# Patient Record
Sex: Female | Born: 1975 | Race: White | Hispanic: No | Marital: Married | Smoking: Never smoker
Health system: Southern US, Community
[De-identification: ages and names within clinical notes are randomized; demographics above are authoritative.]

## PROBLEM LIST (undated history)

## (undated) DIAGNOSIS — Z789 Other specified health status: Secondary | ICD-10-CM

## (undated) DIAGNOSIS — N83209 Unspecified ovarian cyst, unspecified side: Secondary | ICD-10-CM

## (undated) DIAGNOSIS — F419 Anxiety disorder, unspecified: Secondary | ICD-10-CM

## (undated) DIAGNOSIS — Z923 Personal history of irradiation: Secondary | ICD-10-CM

## (undated) DIAGNOSIS — IMO0002 Reserved for concepts with insufficient information to code with codable children: Secondary | ICD-10-CM

## (undated) DIAGNOSIS — R87619 Unspecified abnormal cytological findings in specimens from cervix uteri: Secondary | ICD-10-CM

## (undated) HISTORY — PX: OVARIAN CYST SURGERY: SHX726

## (undated) HISTORY — PX: COLPOSCOPY: SHX161

---

## 1999-08-18 ENCOUNTER — Other Ambulatory Visit: Admission: RE | Admit: 1999-08-18 | Discharge: 1999-08-18 | Payer: Self-pay | Admitting: Family Medicine

## 2000-08-21 ENCOUNTER — Other Ambulatory Visit: Admission: RE | Admit: 2000-08-21 | Discharge: 2000-08-21 | Payer: Self-pay | Admitting: Family Medicine

## 2001-09-03 ENCOUNTER — Other Ambulatory Visit: Admission: RE | Admit: 2001-09-03 | Discharge: 2001-09-03 | Payer: Self-pay | Admitting: Family Medicine

## 2002-10-04 ENCOUNTER — Other Ambulatory Visit: Admission: RE | Admit: 2002-10-04 | Discharge: 2002-10-04 | Payer: Self-pay | Admitting: Family Medicine

## 2003-10-28 ENCOUNTER — Other Ambulatory Visit: Admission: RE | Admit: 2003-10-28 | Discharge: 2003-10-28 | Payer: Self-pay | Admitting: Family Medicine

## 2004-11-01 ENCOUNTER — Ambulatory Visit: Payer: Self-pay | Admitting: Family Medicine

## 2004-11-01 ENCOUNTER — Other Ambulatory Visit: Admission: RE | Admit: 2004-11-01 | Discharge: 2004-11-01 | Payer: Self-pay | Admitting: Family Medicine

## 2005-01-06 ENCOUNTER — Ambulatory Visit: Payer: Self-pay | Admitting: Family Medicine

## 2005-04-08 ENCOUNTER — Ambulatory Visit: Payer: Self-pay | Admitting: Family Medicine

## 2005-10-14 ENCOUNTER — Ambulatory Visit: Payer: Self-pay | Admitting: Family Medicine

## 2005-11-18 ENCOUNTER — Other Ambulatory Visit: Admission: RE | Admit: 2005-11-18 | Discharge: 2005-11-18 | Payer: Self-pay | Admitting: Family Medicine

## 2005-11-18 ENCOUNTER — Ambulatory Visit: Payer: Self-pay | Admitting: Family Medicine

## 2007-03-12 ENCOUNTER — Telehealth: Payer: Self-pay | Admitting: Family Medicine

## 2007-04-18 ENCOUNTER — Encounter (INDEPENDENT_AMBULATORY_CARE_PROVIDER_SITE_OTHER): Payer: Self-pay | Admitting: Nurse Practitioner

## 2007-04-18 LAB — CONVERTED CEMR LAB
AST: 17 units/L
BUN: 7 mg/dL
Calcium: 9.7 mg/dL
Chloride: 104 meq/L
Glucose, Bld: 95 mg/dL
HDL: 64 mg/dL
Total Bilirubin: 0.7 mg/dL
Total CHOL/HDL Ratio: 2.9
Triglycerides: 80 mg/dL

## 2007-05-12 ENCOUNTER — Encounter (INDEPENDENT_AMBULATORY_CARE_PROVIDER_SITE_OTHER): Payer: Self-pay | Admitting: Nurse Practitioner

## 2007-05-14 DIAGNOSIS — I10 Essential (primary) hypertension: Secondary | ICD-10-CM | POA: Insufficient documentation

## 2011-02-11 ENCOUNTER — Other Ambulatory Visit (HOSPITAL_COMMUNITY): Payer: Self-pay | Admitting: Obstetrics and Gynecology

## 2011-02-11 ENCOUNTER — Ambulatory Visit (HOSPITAL_COMMUNITY)
Admission: RE | Admit: 2011-02-11 | Discharge: 2011-02-11 | Disposition: A | Discharge status: Home / Self Care | Payer: 59 | Source: Ambulatory Visit | Attending: Obstetrics and Gynecology | Admitting: Obstetrics and Gynecology

## 2011-02-11 DIAGNOSIS — N801 Endometriosis of ovary: Secondary | ICD-10-CM | POA: Insufficient documentation

## 2011-02-11 DIAGNOSIS — N83209 Unspecified ovarian cyst, unspecified side: Secondary | ICD-10-CM | POA: Insufficient documentation

## 2011-02-11 DIAGNOSIS — N80109 Endometriosis of ovary, unspecified side, unspecified depth: Secondary | ICD-10-CM | POA: Insufficient documentation

## 2011-02-11 LAB — CBC
HCT: 43.1 % (ref 36.0–46.0)
Hemoglobin: 14.5 g/dL (ref 12.0–15.0)
MCHC: 33.6 g/dL (ref 30.0–36.0)
MCV: 92.1 fL (ref 78.0–100.0)
RDW: 13.4 % (ref 11.5–15.5)
WBC: 7.2 10*3/uL (ref 4.0–10.5)

## 2011-02-11 LAB — TYPE AND SCREEN
ABO/RH(D): A POS
Antibody Screen: POSITIVE
DAT, IgG: NEGATIVE

## 2011-02-11 LAB — SURGICAL PCR SCREEN: Staphylococcus aureus: NEGATIVE

## 2011-02-25 NOTE — Op Note (Signed)
NAME:  Sydney Giles, Sydney Giles              ACCOUNT NO.:  [id number]  MEDICAL RECORD NO.:  [id number]           PATIENT TYPE:  O  LOCATION:  [facility]                          [facility]:  [facility]  PHYSICIAN:  Zelphia Cairo, MD    DATE OF BIRTH:  09-22-1976  DATE OF PROCEDURE:  02/11/2011 DATE OF DISCHARGE:                              OPERATIVE REPORT   PREOPERATIVE DIAGNOSIS:  Left ovarian cyst.  POSTOPERATIVE DIAGNOSES:  Left ovarian cyst, endometriosis, pathology pending.  PROCEDURES: 1. Diagnostic laparoscopy. 2. Lysis of adhesions. 3. Left ovarian cystectomy.  SURGEON:  Zelphia Cairo, MD  ANESTHESIA:  General.  FINDINGS: 1. Left endometrioma. 2. Adhesions of left ovary to the left ovarian fossa and sigmoid     colon.  SPECIMEN:  Left ovarian cyst wall.  BLOOD LOSS:  Minimal.  COMPLICATIONS:  Uterine perforation.  CONDITION:  Stable to recovery room.  PROCEDURE:  Trace was taken to the operating room after informed consent was obtained.  She was given general anesthesia and placed in the dorsal lithotomy position using Allen stirrups.  She was prepped and draped in sterile fashion and an in-and-out catheter was used to drain her bladder.  A bivalve speculum was placed in the vagina and a single- tooth tenaculum placed on the anterior lip of the cervix.  The Hulka clamp was placed through the cervix and attached to the anterior lip of the cervix.  The tenaculum and speculum were removed, then our attention was turned to the abdomen.  Marcaine 0.25% was used to provide local anesthesia at the site of our abdominal incision.  A scalpel was then used to make an infraumbilical skin incision.  This was extended to the level of the fascia bluntly using a Kelly clamp.  Optical trocar was then inserted under direct visualization.  Once intraperitoneal placement was confirmed, CO2 was turned on and the abdomen and pelvis were visualized.  Right upper quadrant and appendix  appeared normal.  Right ovary and fallopian tube appeared normal.  Uterus appeared normal.  The left ovary was enlarged and the posterior side of the ovary was adhered to the left ovarian fossa.  The left fallopian tube was adhered with thin filmy adhesions to the left sigmoid colon and left pelvic sidewall.  Monopolar scissors were used to dissect adhesions from the left fallopian tube.  A 5-mm incision was then made in the suprapubic region and a 5-mm trocar was inserted under direct visualization.  Atraumatic graspers were used to grasp the left ovary.  The patient was placed in Trendelenburg position and scissors were used to make a small incision over the left ovarian cyst wall.  The cyst wall was ruptured and endometrioma substance spilled into the pelvis.  The million-dollar graspers were then used to dissect the ovarian cyst wall from the ovarian tissue.  The Enseal device was then used to transect the base of the ovarian cyst wall from the ovary.  The base of the ovary and cyst wall were then cauterized. The pelvis was then copiously irrigated.  When the uterus was being tented upwards with the Hulka clamp, anterior perforation occurred, Hulka clamp was  removed.  A small amount of oozing from the anterior uterus was noted, but this was cauterized and hemostasis was achieved. The left ovary was then found to be hemostatic and Interceed was draped over the left ovary as an adhesion barrier.  All instruments were then removed from the abdomen.  A deep stitch was placed in the infraumbilical skin incision and the skin was reapproximated with Vicryl and Dermabond.  The patient was then taken to the recovery room in stable condition.  Sponge, lap, needle and instrument counts were correct x2.     Zelphia Cairo, MD     GA/MEDQ  D:  02/11/2011  T:  02/11/2011  Job:  [id number]  Electronically Signed by Zelphia Cairo MD on 02/25/2011 11:10:33 AM

## 2011-07-04 ENCOUNTER — Other Ambulatory Visit (HOSPITAL_COMMUNITY): Payer: Self-pay | Admitting: Obstetrics and Gynecology

## 2011-07-04 DIAGNOSIS — N979 Female infertility, unspecified: Secondary | ICD-10-CM

## 2011-07-11 ENCOUNTER — Ambulatory Visit (HOSPITAL_COMMUNITY)
Admission: RE | Admit: 2011-07-11 | Discharge: 2011-07-11 | Disposition: A | Discharge status: Home / Self Care | Payer: 59 | Source: Ambulatory Visit | Attending: Obstetrics and Gynecology | Admitting: Obstetrics and Gynecology

## 2011-07-11 DIAGNOSIS — N979 Female infertility, unspecified: Secondary | ICD-10-CM

## 2011-07-11 MED ORDER — IOHEXOL 300 MG/ML  SOLN: 45.0000 mL | Freq: Once | INTRAMUSCULAR | Status: AC | PRN

## 2011-10-04 NOTE — L&D Delivery Note (Signed)
Delivery Note At 5:45 PM a viable female was delivered via Vaginal, Spontaneous Delivery (Presentation: Left Occiput Anterior).  APGAR: 9, 9; weight .   Placenta status: Intact, Spontaneous.  Cord: 3 vessels with the following complications: None.  Cord pH: na  Anesthesia: Epidural  Episiotomy: None Lacerations: 1st degree;Perineal Suture Repair: 2.0 chromic Est. Blood Loss (mL): 400  Mom to postpartum.  Baby to nursery-stable.  Manish Ruggiero S 05/13/2012, 5:56 PM

## 2011-10-18 LAB — OB RESULTS CONSOLE HIV ANTIBODY (ROUTINE TESTING): HIV: NONREACTIVE

## 2011-10-18 LAB — OB RESULTS CONSOLE ABO/RH

## 2011-10-18 LAB — OB RESULTS CONSOLE RPR: RPR: NONREACTIVE

## 2011-11-02 LAB — OB RESULTS CONSOLE GC/CHLAMYDIA: Chlamydia: NEGATIVE

## 2012-04-30 LAB — OB RESULTS CONSOLE GBS: GBS: POSITIVE

## 2012-05-13 ENCOUNTER — Encounter (HOSPITAL_COMMUNITY): Payer: Self-pay | Admitting: Anesthesiology

## 2012-05-13 ENCOUNTER — Inpatient Hospital Stay (HOSPITAL_COMMUNITY)
Admission: AD | Admit: 2012-05-13 | Discharge: 2012-05-15 | DRG: 775 | Disposition: A | Discharge status: Home / Self Care | Payer: 59 | Source: Ambulatory Visit | Attending: Obstetrics and Gynecology | Admitting: Obstetrics and Gynecology

## 2012-05-13 ENCOUNTER — Inpatient Hospital Stay (HOSPITAL_COMMUNITY): Payer: 59 | Admitting: Anesthesiology

## 2012-05-13 ENCOUNTER — Encounter (HOSPITAL_COMMUNITY): Payer: Self-pay | Admitting: *Deleted

## 2012-05-13 DIAGNOSIS — I10 Essential (primary) hypertension: Secondary | ICD-10-CM

## 2012-05-13 DIAGNOSIS — Z22330 Carrier of Group B streptococcus: Secondary | ICD-10-CM

## 2012-05-13 DIAGNOSIS — O99892 Other specified diseases and conditions complicating childbirth: Secondary | ICD-10-CM | POA: Diagnosis present

## 2012-05-13 DIAGNOSIS — O09519 Supervision of elderly primigravida, unspecified trimester: Secondary | ICD-10-CM | POA: Diagnosis present

## 2012-05-13 HISTORY — DX: Unspecified abnormal cytological findings in specimens from cervix uteri: R87.619

## 2012-05-13 HISTORY — DX: Reserved for concepts with insufficient information to code with codable children: IMO0002

## 2012-05-13 HISTORY — DX: Unspecified ovarian cyst, unspecified side: N83.209

## 2012-05-13 HISTORY — DX: Other specified health status: Z78.9

## 2012-05-13 LAB — POCT FERN TEST: Fern Test: POSITIVE

## 2012-05-13 LAB — CBC
HCT: 35.4 % — ABNORMAL LOW (ref 36.0–46.0)
Hemoglobin: 11.5 g/dL — ABNORMAL LOW (ref 12.0–15.0)
RBC: 3.9 MIL/uL (ref 3.87–5.11)
WBC: 11.3 10*3/uL — ABNORMAL HIGH (ref 4.0–10.5)

## 2012-05-13 LAB — RPR: RPR Ser Ql: REACTIVE — AB

## 2012-05-13 LAB — RPR TITER: RPR Titer: 1:2 {titer} — AB

## 2012-05-13 LAB — TYPE AND SCREEN: ABO/RH(D): A POS

## 2012-05-13 MED ORDER — CLINDAMYCIN PHOSPHATE 900 MG/50ML IV SOLN
900.0000 mg | Freq: Once | INTRAVENOUS | Status: AC
  Administered 2012-05-13: 900 mg via INTRAVENOUS
  Filled 2012-05-13: qty 50

## 2012-05-13 MED ORDER — LACTATED RINGERS IV SOLN
500.0000 mL | Freq: Once | INTRAVENOUS | Status: AC
  Administered 2012-05-13: 10:00:00 via INTRAVENOUS

## 2012-05-13 MED ORDER — EPHEDRINE 5 MG/ML INJ
10.0000 mg | INTRAVENOUS | Status: DC | PRN
  Filled 2012-05-13: qty 4

## 2012-05-13 MED ORDER — LACTATED RINGERS IV SOLN: 500.0000 mL | INTRAVENOUS | Status: DC | PRN

## 2012-05-13 MED ORDER — ACETAMINOPHEN 325 MG PO TABS: 650.0000 mg | ORAL_TABLET | ORAL | Status: DC | PRN

## 2012-05-13 MED ORDER — IBUPROFEN 600 MG PO TABS
600.0000 mg | ORAL_TABLET | Freq: Four times a day (QID) | ORAL | Status: DC
  Administered 2012-05-13 – 2012-05-15 (×5): 600 mg via ORAL
  Filled 2012-05-13 (×6): qty 1

## 2012-05-13 MED ORDER — SIMETHICONE 80 MG PO CHEW: 80.0000 mg | CHEWABLE_TABLET | ORAL | Status: DC | PRN

## 2012-05-13 MED ORDER — SODIUM BICARBONATE 8.4 % IV SOLN
INTRAVENOUS | Status: DC | PRN
  Administered 2012-05-13: 4 mL via EPIDURAL

## 2012-05-13 MED ORDER — ONDANSETRON HCL 4 MG PO TABS: 4.0000 mg | ORAL_TABLET | ORAL | Status: DC | PRN

## 2012-05-13 MED ORDER — FLEET ENEMA 7-19 GM/118ML RE ENEM: 1.0000 | ENEMA | Freq: Every day | RECTAL | Status: DC | PRN

## 2012-05-13 MED ORDER — ONDANSETRON HCL 4 MG/2ML IJ SOLN: 4.0000 mg | Freq: Four times a day (QID) | INTRAMUSCULAR | Status: DC | PRN

## 2012-05-13 MED ORDER — EPHEDRINE 5 MG/ML INJ: 10.0000 mg | INTRAVENOUS | Status: DC | PRN

## 2012-05-13 MED ORDER — DIPHENHYDRAMINE HCL 25 MG PO CAPS: 25.0000 mg | ORAL_CAPSULE | Freq: Four times a day (QID) | ORAL | Status: DC | PRN

## 2012-05-13 MED ORDER — DIBUCAINE 1 % RE OINT: 1.0000 "application " | TOPICAL_OINTMENT | RECTAL | Status: DC | PRN

## 2012-05-13 MED ORDER — LANOLIN HYDROUS EX OINT: TOPICAL_OINTMENT | CUTANEOUS | Status: DC | PRN

## 2012-05-13 MED ORDER — ZOLPIDEM TARTRATE 5 MG PO TABS: 5.0000 mg | ORAL_TABLET | Freq: Every evening | ORAL | Status: DC | PRN

## 2012-05-13 MED ORDER — FENTANYL 2.5 MCG/ML BUPIVACAINE 1/10 % EPIDURAL INFUSION (WH - ANES)
14.0000 mL/h | INTRAMUSCULAR | Status: DC
  Filled 2012-05-13 (×3): qty 60

## 2012-05-13 MED ORDER — SENNOSIDES-DOCUSATE SODIUM 8.6-50 MG PO TABS
2.0000 | ORAL_TABLET | Freq: Every day | ORAL | Status: DC
  Administered 2012-05-13 – 2012-05-14 (×2): 2 via ORAL

## 2012-05-13 MED ORDER — TERBUTALINE SULFATE 1 MG/ML IJ SOLN: 0.2500 mg | Freq: Once | INTRAMUSCULAR | Status: DC | PRN

## 2012-05-13 MED ORDER — TETANUS-DIPHTH-ACELL PERTUSSIS 5-2.5-18.5 LF-MCG/0.5 IM SUSP: 0.5000 mL | Freq: Once | INTRAMUSCULAR | Status: DC

## 2012-05-13 MED ORDER — OXYCODONE-ACETAMINOPHEN 5-325 MG PO TABS: 1.0000 | ORAL_TABLET | ORAL | Status: DC | PRN

## 2012-05-13 MED ORDER — WITCH HAZEL-GLYCERIN EX PADS: 1.0000 "application " | MEDICATED_PAD | CUTANEOUS | Status: DC | PRN

## 2012-05-13 MED ORDER — PHENYLEPHRINE 40 MCG/ML (10ML) SYRINGE FOR IV PUSH (FOR BLOOD PRESSURE SUPPORT)
80.0000 ug | PREFILLED_SYRINGE | INTRAVENOUS | Status: DC | PRN
  Administered 2012-05-13: 80 ug via INTRAVENOUS

## 2012-05-13 MED ORDER — PHENYLEPHRINE 40 MCG/ML (10ML) SYRINGE FOR IV PUSH (FOR BLOOD PRESSURE SUPPORT)
80.0000 ug | PREFILLED_SYRINGE | INTRAVENOUS | Status: DC | PRN
  Administered 2012-05-13: 80 ug via INTRAVENOUS
  Filled 2012-05-13: qty 5

## 2012-05-13 MED ORDER — FENTANYL 2.5 MCG/ML BUPIVACAINE 1/10 % EPIDURAL INFUSION (WH - ANES)
INTRAMUSCULAR | Status: DC | PRN
  Administered 2012-05-13: 14 mL/h via EPIDURAL

## 2012-05-13 MED ORDER — BISACODYL 10 MG RE SUPP: 10.0000 mg | Freq: Every day | RECTAL | Status: DC | PRN

## 2012-05-13 MED ORDER — DIPHENHYDRAMINE HCL 50 MG/ML IJ SOLN: 12.5000 mg | INTRAMUSCULAR | Status: DC | PRN

## 2012-05-13 MED ORDER — FLEET ENEMA 7-19 GM/118ML RE ENEM: 1.0000 | ENEMA | RECTAL | Status: DC | PRN

## 2012-05-13 MED ORDER — LIDOCAINE HCL (PF) 1 % IJ SOLN
30.0000 mL | INTRAMUSCULAR | Status: DC | PRN
  Administered 2012-05-13: 30 mL via SUBCUTANEOUS
  Filled 2012-05-13: qty 30

## 2012-05-13 MED ORDER — LACTATED RINGERS IV SOLN
INTRAVENOUS | Status: DC
  Administered 2012-05-13 (×2): via INTRAVENOUS

## 2012-05-13 MED ORDER — PRENATAL MULTIVITAMIN CH
1.0000 | ORAL_TABLET | Freq: Every day | ORAL | Status: DC
  Administered 2012-05-14 – 2012-05-15 (×2): 1 via ORAL
  Filled 2012-05-13 (×2): qty 1

## 2012-05-13 MED ORDER — OXYTOCIN 40 UNITS IN LACTATED RINGERS INFUSION - SIMPLE MED
1.0000 m[IU]/min | INTRAVENOUS | Status: DC
  Administered 2012-05-13: 2 m[IU]/min via INTRAVENOUS
  Filled 2012-05-13: qty 1000

## 2012-05-13 MED ORDER — BENZOCAINE-MENTHOL 20-0.5 % EX AERO
1.0000 "application " | INHALATION_SPRAY | CUTANEOUS | Status: DC | PRN
  Administered 2012-05-13: 1 via TOPICAL
  Filled 2012-05-13: qty 56

## 2012-05-13 MED ORDER — OXYTOCIN 40 UNITS IN LACTATED RINGERS INFUSION - SIMPLE MED
62.5000 mL/h | Freq: Once | INTRAVENOUS | Status: AC
  Administered 2012-05-13: 62.5 mL/h via INTRAVENOUS

## 2012-05-13 MED ORDER — CITRIC ACID-SODIUM CITRATE 334-500 MG/5ML PO SOLN: 30.0000 mL | ORAL | Status: DC | PRN

## 2012-05-13 MED ORDER — OXYTOCIN BOLUS FROM INFUSION
250.0000 mL | Freq: Once | INTRAVENOUS | Status: DC
  Filled 2012-05-13: qty 500

## 2012-05-13 MED ORDER — IBUPROFEN 600 MG PO TABS: 600.0000 mg | ORAL_TABLET | Freq: Four times a day (QID) | ORAL | Status: DC | PRN

## 2012-05-13 MED ORDER — CLINDAMYCIN PHOSPHATE 900 MG/50ML IV SOLN
900.0000 mg | Freq: Three times a day (TID) | INTRAVENOUS | Status: DC
  Administered 2012-05-13: 900 mg via INTRAVENOUS
  Filled 2012-05-13 (×2): qty 50

## 2012-05-13 MED ORDER — ONDANSETRON HCL 4 MG/2ML IJ SOLN: 4.0000 mg | INTRAMUSCULAR | Status: DC | PRN

## 2012-05-13 NOTE — H&P (Signed)
Sydney Giles is a 36 y.o. female  At 61.6 with SROM.  Positive GBS. Maternal Medical History:  Reason for admission: Reason for admission: rupture of membranes.  Contractions: Onset was 3-5 hours ago.   Frequency: irregular.   Perceived severity is mild.    Fetal activity: Perceived fetal activity is normal.    Prenatal complications: no prenatal complications Prenatal Complications - Diabetes: none.    OB History    Grav Para Term Preterm Abortions TAB SAB Ect Mult Living   1              Past Medical History  Diagnosis Date  . No pertinent past medical history   . Abnormal Pap smear   . Ovarian cyst    Past Surgical History  Procedure Date  . Ovarian cyst surgery   . Colposcopy    Family History: family history is not on file. Social History:  reports that she has never smoked. She has never used smokeless tobacco. She reports that she does not drink alcohol or use illicit drugs.   Prenatal Transfer Tool  Maternal Diabetes: No Genetic Screening: Declined Maternal Ultrasounds/Referrals: Normal Fetal Ultrasounds or other Referrals:  None Maternal Substance Abuse:  No Significant Maternal Medications:  None Significant Maternal Lab Results:  None Other Comments:  None  ROS  Dilation: 1 Effacement (%): 0 Station: -1 Exam by:: Dr.Eleanora Guinyard Blood pressure 102/53, pulse 77, temperature 98.5 F (36.9 C), temperature source Oral, resp. rate 18, height 5\' 7"  (1.702 m), weight 97.977 kg (216 lb), last menstrual period 08/22/2011, SpO2 100.00%. Maternal Exam:  Uterine Assessment: Contraction strength is mild.  Contraction frequency is irregular.   Abdomen: Patient reports no abdominal tenderness. Fundal height is c/w dates.   Estimated fetal weight is 7.   Fetal presentation: vertex  Introitus: Amniotic fluid character: clear.  Pelvis: adequate for delivery.   Cervix: Cervix evaluated by digital exam.     Physical Exam  Prenatal labs: ABO, Rh: A/Positive/--  (01/15 0000) Antibody: Negative (01/15 0000) Rubella: Immune (01/15 0000) RPR: Nonreactive (01/15 0000)  HBsAg: Negative (01/15 0000)  HIV: Non-reactive (01/15 0000)  GBS: Positive (07/29 0000)   Assessment/Plan: IUP at 37.6 with SROM. POsitive GBS IV cleocin  Risk of pitocin disscussed   Sydney Giles S 05/13/2012, 7:32 AM

## 2012-05-13 NOTE — Anesthesia Preprocedure Evaluation (Addendum)
Anesthesia Evaluation  Patient identified by MRN, date of birth, ID band Patient awake    Reviewed: Allergy & Precautions, H&P , Patient's Chart, lab work & pertinent test results  History of Anesthesia Complications (+) DIFFICULT AIRWAY  Airway Mallampati: II TM Distance: >3 FB Neck ROM: full    Dental  (+) Teeth Intact   Pulmonary  breath sounds clear to auscultation        Cardiovascular Rhythm:regular Rate:Normal     Neuro/Psych    GI/Hepatic   Endo/Other    Renal/GU      Musculoskeletal   Abdominal   Peds  Hematology   Anesthesia Other Findings       Reproductive/Obstetrics (+) Pregnancy                           Anesthesia Physical Anesthesia Plan  ASA: II  Anesthesia Plan: Epidural   Post-op Pain Management:    Induction:   Airway Management Planned:   Additional Equipment:   Intra-op Plan:   Post-operative Plan:   Informed Consent: I have reviewed the patients History and Physical, chart, labs and discussed the procedure including the risks, benefits and alternatives for the proposed anesthesia with the patient or authorized representative who has indicated his/her understanding and acceptance.   Dental Advisory Given  Plan Discussed with:   Anesthesia Plan Comments: (Labs checked- platelets confirmed with RN in room. Fetal heart tracing, per RN, reported to be stable enough for sitting procedure. Discussed epidural, and patient consents to the procedure:  included risk of possible headache,backache, failed block, allergic reaction, and nerve injury. This patient was asked if she had any questions or concerns before the procedure started. )        Anesthesia Quick Evaluation  

## 2012-05-13 NOTE — MAU Note (Signed)
C/o leaking clear fluid since 0130 and irregular ucs.

## 2012-05-13 NOTE — Anesthesia Procedure Notes (Signed)

## 2012-05-13 NOTE — Progress Notes (Signed)
Pt repositioning frequently monitors tracing maternal HR at times

## 2012-05-14 LAB — T.PALLIDUM AB, IGG: T pallidum Antibodies (TP-PA): 0.09 S/CO (ref <0.90)

## 2012-05-14 LAB — CBC
Platelets: 155 10*3/uL (ref 150–400)
RBC: 3.64 MIL/uL — ABNORMAL LOW (ref 3.87–5.11)
WBC: 17.9 10*3/uL — ABNORMAL HIGH (ref 4.0–10.5)

## 2012-05-14 NOTE — Progress Notes (Signed)
No C/O Decreasing lochia, voiding  VSS Afeb FFNT  Results for orders placed during the hospital encounter of 05/13/12 (from the past 24 hour(s))  CBC     Status: Abnormal   Collection Time   05/14/12  5:11 AM      Component Value Range   WBC 17.9 (*) 4.0 - 10.5 K/uL   RBC 3.64 (*) 3.87 - 5.11 MIL/uL   Hemoglobin 10.8 (*) 12.0 - 15.0 g/dL   HCT 11.9 (*) 14.7 - 82.9 %   MCV 90.4  78.0 - 100.0 fL   MCH 29.7  26.0 - 34.0 pg   MCHC 32.8  30.0 - 36.0 g/dL   RDW 56.2  13.0 - 86.5 %   Platelets 155  150 - 400 K/uL   A: Satisfactory  P: Per orders     Prob D/C in am

## 2012-05-14 NOTE — Anesthesia Postprocedure Evaluation (Signed)
  Anesthesia Post Note  Patient: Sydney Giles  Procedure(s) Performed: * No procedures listed *  Anesthesia type: Epidural  Patient location: Mother/Baby  Post pain: Pain level controlled  Post assessment: Post-op Vital signs reviewed  Last Vitals:  Filed Vitals:   05/14/12 0650  BP: 105/62  Pulse: 73  Temp: 36.8 C  Resp: 18    Post vital signs: Reviewed  Level of consciousness:alert  Complications: No apparent anesthesia complications

## 2012-05-15 NOTE — Discharge Summary (Signed)
Obstetric Discharge Summary Reason for Admission: rupture of membranes Prenatal Procedures: ultrasound Intrapartum Procedures: spontaneous vaginal delivery Postpartum Procedures: none Complications-Operative and Postpartum: 1 degree perineal laceration Hemoglobin  Date Value Range Status  05/14/2012 10.8* 12.0 - 15.0 g/dL Final     HCT  Date Value Range Status  05/14/2012 32.9* 36.0 - 46.0 % Final    Physical Exam:  General: alert and cooperative Lochia: appropriate Uterine Fundus: firm Incision: perineum intact DVT Evaluation: No evidence of DVT seen on physical exam.  Discharge Diagnoses: Term Pregnancy-delivered  Discharge Information: Date: 05/15/2012 Activity: pelvic rest Diet: routine Medications: PNV and Ibuprofen Condition: stable Instructions: refer to practice specific booklet Discharge to: home   Newborn Data: Live born female  Birth Weight: 7 lb 1.2 oz (3210 g) APGAR: 9, 9  Home with mother.  CURTIS,CAROL G 05/15/2012, 8:02 AM

## 2012-09-05 ENCOUNTER — Other Ambulatory Visit: Payer: Self-pay | Admitting: Obstetrics and Gynecology

## 2014-08-04 ENCOUNTER — Encounter (HOSPITAL_COMMUNITY): Payer: Self-pay | Admitting: *Deleted

## 2015-01-12 ENCOUNTER — Other Ambulatory Visit: Payer: Self-pay | Admitting: Obstetrics and Gynecology

## 2015-01-13 LAB — CYTOLOGY - PAP

## 2016-03-31 ENCOUNTER — Other Ambulatory Visit: Payer: Self-pay | Admitting: Obstetrics and Gynecology

## 2016-03-31 DIAGNOSIS — R928 Other abnormal and inconclusive findings on diagnostic imaging of breast: Secondary | ICD-10-CM

## 2016-04-08 ENCOUNTER — Ambulatory Visit
Admission: RE | Admit: 2016-04-08 | Discharge: 2016-04-08 | Disposition: A | Discharge status: Home / Self Care | Payer: 59 | Source: Ambulatory Visit | Attending: Obstetrics and Gynecology | Admitting: Obstetrics and Gynecology

## 2016-04-08 DIAGNOSIS — R928 Other abnormal and inconclusive findings on diagnostic imaging of breast: Secondary | ICD-10-CM

## 2016-07-26 ENCOUNTER — Encounter: Payer: Self-pay | Admitting: Emergency Medicine

## 2016-07-26 ENCOUNTER — Inpatient Hospital Stay
Admission: EM | Admit: 2016-07-26 | Discharge: 2016-07-28 | DRG: 872 | Disposition: A | Discharge status: Home / Self Care | Payer: 59 | Attending: Internal Medicine | Admitting: Internal Medicine

## 2016-07-26 ENCOUNTER — Emergency Department: Payer: 59

## 2016-07-26 DIAGNOSIS — N83209 Unspecified ovarian cyst, unspecified side: Secondary | ICD-10-CM | POA: Diagnosis present

## 2016-07-26 DIAGNOSIS — N83201 Unspecified ovarian cyst, right side: Secondary | ICD-10-CM | POA: Diagnosis present

## 2016-07-26 DIAGNOSIS — N12 Tubulo-interstitial nephritis, not specified as acute or chronic: Secondary | ICD-10-CM | POA: Diagnosis present

## 2016-07-26 DIAGNOSIS — R112 Nausea with vomiting, unspecified: Secondary | ICD-10-CM | POA: Diagnosis present

## 2016-07-26 DIAGNOSIS — E876 Hypokalemia: Secondary | ICD-10-CM | POA: Diagnosis present

## 2016-07-26 DIAGNOSIS — R509 Fever, unspecified: Secondary | ICD-10-CM | POA: Diagnosis present

## 2016-07-26 DIAGNOSIS — B953 Streptococcus pneumoniae as the cause of diseases classified elsewhere: Secondary | ICD-10-CM | POA: Diagnosis present

## 2016-07-26 DIAGNOSIS — B379 Candidiasis, unspecified: Secondary | ICD-10-CM | POA: Diagnosis present

## 2016-07-26 DIAGNOSIS — A419 Sepsis, unspecified organism: Principal | ICD-10-CM | POA: Diagnosis present

## 2016-07-26 DIAGNOSIS — Z793 Long term (current) use of hormonal contraceptives: Secondary | ICD-10-CM | POA: Diagnosis not present

## 2016-07-26 LAB — URINALYSIS COMPLETE WITH MICROSCOPIC (ARMC ONLY)
Bacteria, UA: NONE SEEN
Bilirubin Urine: NEGATIVE
GLUCOSE, UA: NEGATIVE mg/dL
Nitrite: NEGATIVE
Protein, ur: 30 mg/dL — AB
Specific Gravity, Urine: 1.018 (ref 1.005–1.030)
pH: 6 (ref 5.0–8.0)

## 2016-07-26 LAB — COMPREHENSIVE METABOLIC PANEL
ALBUMIN: 3.9 g/dL (ref 3.5–5.0)
ALT: 14 U/L (ref 14–54)
ANION GAP: 11 (ref 5–15)
AST: 18 U/L (ref 15–41)
Alkaline Phosphatase: 43 U/L (ref 38–126)
BILIRUBIN TOTAL: 0.9 mg/dL (ref 0.3–1.2)
BUN: 8 mg/dL (ref 6–20)
CHLORIDE: 106 mmol/L (ref 101–111)
CO2: 20 mmol/L — ABNORMAL LOW (ref 22–32)
Calcium: 9 mg/dL (ref 8.9–10.3)
Creatinine, Ser: 0.78 mg/dL (ref 0.44–1.00)
GFR calc Af Amer: >60 mL/min (ref >60)
Glucose, Bld: 128 mg/dL — ABNORMAL HIGH (ref 65–99)
POTASSIUM: 3.5 mmol/L (ref 3.5–5.1)
Sodium: 137 mmol/L (ref 135–145)
TOTAL PROTEIN: 7.4 g/dL (ref 6.5–8.1)

## 2016-07-26 LAB — CBC WITH DIFFERENTIAL/PLATELET
BASOS ABS: 0.1 10*3/uL (ref 0–0.1)
Basophils Relative: 0 %
Eosinophils Absolute: 0 10*3/uL (ref 0–0.7)
Eosinophils Relative: 0 %
HCT: 44.3 % (ref 35.0–47.0)
HEMOGLOBIN: 14.8 g/dL (ref 12.0–16.0)
LYMPHS ABS: 0.6 10*3/uL — AB (ref 1.0–3.6)
LYMPHS PCT: 2 %
MCH: 30.7 pg (ref 26.0–34.0)
MCHC: 33.3 g/dL (ref 32.0–36.0)
MCV: 92.2 fL (ref 80.0–100.0)
Monocytes Absolute: 0.8 10*3/uL (ref 0.2–0.9)
Monocytes Relative: 3 %
NEUTROS ABS: 27.4 10*3/uL — AB (ref 1.4–6.5)
NEUTROS PCT: 95 %
Platelets: 240 10*3/uL (ref 150–440)
RBC: 4.81 MIL/uL (ref 3.80–5.20)
RDW: 13.2 % (ref 11.5–14.5)
WBC: 28.9 10*3/uL — AB (ref 3.6–11.0)

## 2016-07-26 LAB — PREGNANCY, URINE: Preg Test, Ur: NEGATIVE

## 2016-07-26 LAB — LACTIC ACID, PLASMA: LACTIC ACID, VENOUS: 1.1 mmol/L (ref 0.5–1.9)

## 2016-07-26 MED ORDER — CEFTRIAXONE SODIUM-DEXTROSE 1-3.74 GM-% IV SOLR: 1.0000 g | INTRAVENOUS | Status: DC

## 2016-07-26 MED ORDER — SODIUM CHLORIDE 0.9 % IV BOLUS (SEPSIS)
1000.0000 mL | Freq: Once | INTRAVENOUS | Status: AC
  Administered 2016-07-26: 1000 mL via INTRAVENOUS

## 2016-07-26 MED ORDER — CEFTRIAXONE SODIUM-DEXTROSE 1-3.74 GM-% IV SOLR
1.0000 g | Freq: Once | INTRAVENOUS | Status: AC
  Administered 2016-07-26: 1 g via INTRAVENOUS
  Filled 2016-07-26: qty 50

## 2016-07-26 MED ORDER — MORPHINE SULFATE (PF) 2 MG/ML IV SOLN
4.0000 mg | Freq: Once | INTRAVENOUS | Status: AC
  Administered 2016-07-26: 4 mg via INTRAVENOUS
  Filled 2016-07-26: qty 2

## 2016-07-26 MED ORDER — ENOXAPARIN SODIUM 40 MG/0.4ML ~~LOC~~ SOLN
40.0000 mg | Freq: Every day | SUBCUTANEOUS | Status: DC
  Administered 2016-07-27 (×2): 40 mg via SUBCUTANEOUS
  Filled 2016-07-26 (×2): qty 0.4

## 2016-07-26 MED ORDER — ONDANSETRON HCL 4 MG/2ML IJ SOLN: 4.0000 mg | Freq: Four times a day (QID) | INTRAMUSCULAR | Status: DC | PRN

## 2016-07-26 MED ORDER — ACETAMINOPHEN 650 MG RE SUPP
650.0000 mg | Freq: Four times a day (QID) | RECTAL | Status: DC | PRN
Start: 2016-07-26 — End: 2016-07-28

## 2016-07-26 MED ORDER — MORPHINE SULFATE (PF) 2 MG/ML IV SOLN
2.0000 mg | Freq: Once | INTRAVENOUS | Status: AC
  Administered 2016-07-26: 2 mg via INTRAVENOUS
  Filled 2016-07-26: qty 1

## 2016-07-26 MED ORDER — ACETAMINOPHEN 500 MG PO TABS
1000.0000 mg | ORAL_TABLET | Freq: Once | ORAL | Status: AC
  Administered 2016-07-26: 1000 mg via ORAL

## 2016-07-26 MED ORDER — CITALOPRAM HYDROBROMIDE 20 MG PO TABS: 20.0000 mg | ORAL_TABLET | Freq: Every day | ORAL | Status: DC

## 2016-07-26 MED ORDER — IOPAMIDOL (ISOVUE-300) INJECTION 61%
100.0000 mL | Freq: Once | INTRAVENOUS | Status: AC | PRN
  Administered 2016-07-26: 100 mL via INTRAVENOUS

## 2016-07-26 MED ORDER — ACETAMINOPHEN 325 MG PO TABS
650.0000 mg | ORAL_TABLET | Freq: Four times a day (QID) | ORAL | Status: DC | PRN
Start: 2016-07-26 — End: 2016-07-28
  Administered 2016-07-27: 650 mg via ORAL
  Filled 2016-07-26: qty 2

## 2016-07-26 MED ORDER — ONDANSETRON HCL 4 MG PO TABS: 4.0000 mg | ORAL_TABLET | Freq: Four times a day (QID) | ORAL | Status: DC | PRN

## 2016-07-26 MED ORDER — SODIUM CHLORIDE 0.9% FLUSH
3.0000 mL | Freq: Two times a day (BID) | INTRAVENOUS | Status: DC
  Administered 2016-07-27 – 2016-07-28 (×2): 3 mL via INTRAVENOUS

## 2016-07-26 MED ORDER — IOPAMIDOL (ISOVUE-300) INJECTION 61%
30.0000 mL | Freq: Once | INTRAVENOUS | Status: AC | PRN
  Administered 2016-07-26: 30 mL via ORAL

## 2016-07-26 MED ORDER — OXYCODONE HCL 5 MG PO TABS
5.0000 mg | ORAL_TABLET | ORAL | Status: DC | PRN
  Administered 2016-07-27 (×3): 5 mg via ORAL
  Filled 2016-07-26 (×3): qty 1

## 2016-07-26 MED ORDER — SODIUM CHLORIDE 0.9 % IV SOLN
INTRAVENOUS | Status: AC
  Administered 2016-07-27: 01:00:00 via INTRAVENOUS

## 2016-07-26 MED ORDER — ACETAMINOPHEN 500 MG PO TABS
ORAL_TABLET | ORAL | Status: AC
  Administered 2016-07-26: 1000 mg via ORAL
  Filled 2016-07-26: qty 2

## 2016-07-26 MED ORDER — DEXTROSE 5 % IV SOLN: 1.0000 g | Freq: Once | INTRAVENOUS | Status: DC

## 2016-07-26 MED ORDER — ONDANSETRON HCL 4 MG/2ML IJ SOLN
4.0000 mg | Freq: Once | INTRAMUSCULAR | Status: AC
  Administered 2016-07-26: 4 mg via INTRAVENOUS
  Filled 2016-07-26: qty 2

## 2016-07-26 NOTE — H&P (Signed)
[facility] - [place] at [facility]   PATIENT NAME: Sydney Giles    MR#:  [id number]  DATE OF BIRTH:  01-01-1976  DATE OF ADMISSION:  07/26/2016  PRIMARY CARE PHYSICIAN: No PCP Per Patient   REQUESTING/REFERRING PHYSICIAN: Silverio Lay, MD  CHIEF COMPLAINT:   Chief Complaint  Patient presents with  . Abdominal Pain  . Nausea  . Emesis  . Fever    HISTORY OF PRESENT ILLNESS:  Sydney Giles  is a 40 y.o. female who presents with Fever, abdominal pain. Patient states that she started feeling bad couple of days ago, and her symptoms progressed. She has significant left-sided abdominal flank pain. Here she was found to have UTI, which in conjunction with her clinical symptoms is likely pyelonephritis. She meets sepsis criteria with tachycardia and leukocytosis and fever. Hospitals were called for admission.  PAST MEDICAL HISTORY:   Past Medical History:  Diagnosis Date  . Abnormal Pap smear   . No pertinent past medical history   . Ovarian cyst     PAST SURGICAL HISTORY:   Past Surgical History:  Procedure Laterality Date  . COLPOSCOPY    . OVARIAN CYST SURGERY      SOCIAL HISTORY:   Social History  Substance Use Topics  . Smoking status: Never Smoker  . Smokeless tobacco: Never Used  . Alcohol use No    FAMILY HISTORY:  No family history on file.  DRUG ALLERGIES:  No Known Allergies  MEDICATIONS AT HOME:   Prior to Admission medications   Medication Sig Start Date End Date Taking? Authorizing Provider  citalopram (CELEXA) 20 MG tablet Take 20 mg by mouth daily. 07/22/16  Yes Historical Provider, MD  JUNEL FE 1/20 1-20 MG-MCG tablet Take 1 tablet by mouth daily. 06/27/16  Yes Historical Provider, MD    REVIEW OF SYSTEMS:  Review of Systems  Constitutional: Positive for fever. Negative for chills, malaise/fatigue and weight loss.  HENT: Negative for ear pain, hearing loss and tinnitus.   Eyes: Negative for blurred vision, double  vision, pain and redness.  Respiratory: Negative for cough, hemoptysis and shortness of breath.   Cardiovascular: Negative for chest pain, palpitations, orthopnea and leg swelling.  Gastrointestinal: Positive for abdominal pain and nausea. Negative for constipation, diarrhea and vomiting.  Genitourinary: Positive for flank pain. Negative for dysuria, frequency and hematuria.  Musculoskeletal: Negative for back pain, joint pain and neck pain.  Skin:       No acne, rash, or lesions  Neurological: Negative for dizziness, tremors, focal weakness and weakness.  Endo/Heme/Allergies: Negative for polydipsia. Does not bruise/bleed easily.  Psychiatric/Behavioral: Negative for depression. The patient is not nervous/anxious and does not have insomnia.      VITAL SIGNS:   Vitals:   07/26/16 1841 07/26/16 1900 07/26/16 2044 07/26/16 2220  BP: 126/66 131/73 122/68 (!) 102/57  Pulse: (!) 138  (!) 121 (!) 116  Resp: (!) 22  (!) 24 20  Temp: (!) 101.9 F (38.8 C)  (!) 102.4 F (39.1 C) 99.8 F (37.7 C)  TempSrc: Oral  Oral   SpO2: 97% 98% 99% 98%  Weight: 90.3 kg (199 lb)     Height: 5\' 6"  (1.676 m)      Wt Readings from Last 3 Encounters:  07/26/16 90.3 kg (199 lb)  05/13/12 98 kg (216 lb)    PHYSICAL EXAMINATION:  Physical Exam  Vitals reviewed. Constitutional: She is oriented to person, place, and time. She appears well-developed and well-nourished. No distress.  HENT:  Head: Normocephalic and atraumatic.  Mouth/Throat: Oropharynx is clear and moist.  Eyes: Conjunctivae and EOM are normal. Pupils are equal, round, and reactive to light. No scleral icterus.  Neck: Normal range of motion. Neck supple. No JVD present. No thyromegaly present.  Cardiovascular: Normal rate, regular rhythm and intact distal pulses.  Exam reveals no gallop and no friction rub.   No murmur heard. Respiratory: Effort normal and breath sounds normal. No respiratory distress. She has no wheezes. She has no rales.   GI: Soft. Bowel sounds are normal. She exhibits no distension. There is tenderness.  Musculoskeletal: Normal range of motion. She exhibits tenderness (left flank). She exhibits no edema.  No arthritis, no gout  Lymphadenopathy:    She has no cervical adenopathy.  Neurological: She is alert and oriented to person, place, and time. No cranial nerve deficit.  No dysarthria, no aphasia  Skin: Skin is warm and dry. No rash noted. No erythema.  Psychiatric: She has a normal mood and affect. Her behavior is normal. Judgment and thought content normal.    LABORATORY PANEL:   CBC  Recent Labs Lab 07/26/16 1847  WBC 28.9*  HGB 14.8  HCT 44.3  PLT 240   ------------------------------------------------------------------------------------------------------------------  Chemistries   Recent Labs Lab 07/26/16 1847  NA 137  K 3.5  CL 106  CO2 20*  GLUCOSE 128*  BUN 8  CREATININE 0.78  CALCIUM 9.0  AST 18  ALT 14  ALKPHOS 43  BILITOT 0.9   ------------------------------------------------------------------------------------------------------------------  Cardiac Enzymes No results for input(s): TROPONINI in the last 168 hours. ------------------------------------------------------------------------------------------------------------------  RADIOLOGY:  Ct Abdomen Pelvis W Contrast  Result Date: 07/26/2016 CLINICAL DATA:  Fever and abdominal pain EXAM: CT ABDOMEN AND PELVIS WITH CONTRAST TECHNIQUE: Multidetector CT imaging of the abdomen and pelvis was performed using the standard protocol following bolus administration of intravenous contrast. CONTRAST:  ISOVUE-300 IOPAMIDOL (ISOVUE-300) INJECTION 61% COMPARISON:  None. FINDINGS: Lower chest: Hazy and linear areas of atelectasis in the right middle lobe and bilateral lower lobes. No pleural effusion. Heart size is normal. Distal esophagus unremarkable. Hepatobiliary: There is a 2.7 cm cyst within the posterior right hepatic  lobe. No other focal liver abnormalities are present. No calcified gallstones. No biliary dilatation. Pancreas: Unremarkable. No pancreatic ductal dilatation or surrounding inflammatory changes. Spleen: Normal in size without focal abnormality. Adrenals/Urinary Tract: Adrenal glands are unremarkable. Kidneys are normal, without renal calculi, focal lesion, or hydronephrosis. Bladder is unremarkable. Stomach/Bowel: There is moderate dilatation of stomach. No evidence for small bowel obstruction. Slight thickening of the pylorus.Appendix not well identified, but no right lower quadrant inflammatory process. Vascular/Lymphatic: Non aneurysmal aorta. No pathologically enlarged retroperitoneal or mesenteric nodes. Reproductive: Uterus unremarkable. 4.1 cm possible septated cyst in the right adnexa. Other: No free air or free fluid. 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 IMPRESSION: 1. No CT evidence for acute intra-abdominal or pelvic pathology. 2. Simple hepatic cyst. 3. Possible septated cyst in the right adnexa. Correlation with nonemergent pelvic ultrasound may be obtained. 4. 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 Electronically Signed   By: Jasmine Pang M.D.   On: 07/26/2016 22:18    EKG:  No orders found for this or any previous visit.  IMPRESSION AND PLAN:  Principal Problem:   Sepsis (HCC) - IV antibiotics started in the ED and continued on admission, cultures sent, lactic acid was normal, patient is  hemodynamically stable. Active Problems:   Pyelonephritis - IV antibiotics and cultures as above   Ovarian cyst - right-sided cyst seen on CT abdomen and pelvis, patient is not having discomfort on the right side of her abdomen, likely incidental benign finding.  All the records are reviewed and case discussed with ED provider. Management plans discussed with the patient and/or family.  DVT  PROPHYLAXIS: SubQ lovenox  GI PROPHYLAXIS: None  ADMISSION STATUS: Inpatient  CODE STATUS: Full Code Status History    Date Active Date Inactive Code Status Order ID Comments User Context   05/13/2012  8:01 PM 05/15/2012  1:18 PM Full Code [id number]  Maura Crandall, RN Inpatient      TOTAL TIME TAKING CARE OF THIS PATIENT: 45 minutes.    Sydney Giles FIELDING 07/26/2016, 10:54 PM  [facility]  Office  [phone]  CC: Primary care physician; No PCP Per Patient

## 2016-07-26 NOTE — ED Triage Notes (Signed)
Pt with abd pain, vomiting and fever started last night.

## 2016-07-26 NOTE — ED Notes (Signed)
Pt assisted to bathroom to urinate 

## 2016-07-26 NOTE — ED Notes (Signed)
Patient transported to CT via stretcher.

## 2016-07-26 NOTE — ED Provider Notes (Signed)
  Physical Exam  BP (!) 102/57 (BP Location: Right Arm)   Pulse (!) 116   Temp 99.8 F (37.7 C)   Resp 20   Ht 5\' 6"  (1.676 m)   Wt 199 lb (90.3 kg)   LMP 06/26/2016 (Approximate)   SpO2 98%   BMI 32.12 kg/m   Physical Exam  ED Course  Procedures  MDM Care assumed from Dr. Derrill KayGoodman. Patient has fever since yesterday, diffuse abdominal pain, dysuria. UA + too many to count WBC, CBC showed WBC 28. Sepsis workup initiated. Sign out pending CT ab/pel. CT unremarkable, has incidental R adnexal cyst. She is not tender in RLQ, just L CVAT and LLQ. Given rocephin. Code sepsis called. Given IVF. Will admit for sepsis from pyelo.      Charlynne Panderavid Hsienta Yao, MD 07/26/16 2232

## 2016-07-26 NOTE — ED Provider Notes (Signed)
[facility] Emergency Department Provider Note  ____________________________________________   I have reviewed the triage vital signs and the nursing notes.   HISTORY  Chief Complaint Abdominal Pain; Nausea; Emesis; and Fever   History limited by: Not Limited   HPI Sydney Giles is a 40 y.o. female who presents to the emergency department today because of concern for fever and abdominal pain. The patient states that she started feeling sick yesterday. The pain is located primarily in the left lower and epigastric region of the abdomen. It has been severe. Fever was associated with it last night. The patient additionally had multiple episodes of vomiting last night but only one today. No blood in it. Did have some diarrhea as well. Denied any dysuria or bad odor to her urine. Denies similar symptoms in the past.   Past Medical History:  Diagnosis Date  . Abnormal Pap smear   . No pertinent past medical history   . Ovarian cyst     Patient Active Problem List   Diagnosis Date Noted  . Spontaneous vaginal delivery 05/13/2012    Class: Status post  . HYPERTENSION 05/14/2007    Past Surgical History:  Procedure Laterality Date  . COLPOSCOPY    . OVARIAN CYST SURGERY      Prior to Admission medications   Medication Sig Start Date End Date Taking? Authorizing Provider  calcium carbonate (TUMS - DOSED IN MG ELEMENTAL CALCIUM) 500 MG chewable tablet Chew 2 tablets by mouth 2 (two) times daily as needed. For heartburn    Historical Provider, MD  Prenatal Vit-Fe Fumarate-FA (PRENATAL MULTIVITAMIN) TABS Take 1 tablet by mouth daily.    Historical Provider, MD    Allergies Review of patient's allergies indicates no known allergies.  No family history on file.  Social History Social History  Substance Use Topics  . Smoking status: Never Smoker  . Smokeless tobacco: Never Used  . Alcohol use No    Review of Systems  Constitutional: Positive  for fever. Cardiovascular: Negative for chest pain. Respiratory: Negative for shortness of breath. Gastrointestinal: Positive for abdominal pain, nausea, vomiting and diarrhea.  Genitourinary: Negative for dysuria. Musculoskeletal: Negative for back pain. Skin: Negative for rash. Neurological: Negative for headaches, focal weakness or numbness.  10-point ROS otherwise negative.  ____________________________________________   PHYSICAL EXAM:  VITAL SIGNS: ED Triage Vitals  Enc Vitals Group     BP 07/26/16 1841 126/66     Pulse Rate 07/26/16 1841 (!) 138     Resp 07/26/16 1841 (!) 22     Temp 07/26/16 1841 (!) 101.9 F (38.8 C)     Temp Source 07/26/16 1841 Oral     SpO2 07/26/16 1841 97 %     Weight 07/26/16 1841 199 lb (90.3 kg)     Height 07/26/16 1841 5\' 6"  (1.676 m)     Head Circumference --      Peak Flow --      Pain Score 07/26/16 1842 5   Constitutional: Alert and oriented. Appears uncomfortable.  Eyes: Conjunctivae are normal. Normal extraocular movements. ENT   Head: Normocephalic and atraumatic.   Nose: No congestion/rhinnorhea.   Mouth/Throat: Mucous membranes are moist.   Neck: No stridor. Hematological/Lymphatic/Immunilogical: No cervical lymphadenopathy. Cardiovascular: Tachycardic, regular rhythm.  No murmurs, rubs, or gallops.  Respiratory: Normal respiratory effort without tachypnea nor retractions. Breath sounds are clear and equal bilaterally. No wheezes/rales/rhonchi. Gastrointestinal: Soft and somewhat diffusely tender to palpation, worse in the left lower quadrant and epigastric  pain. Genitourinary: Deferred Musculoskeletal: Normal range of motion in all extremities. No lower extremity edema. Neurologic:  Normal speech and language. No gross focal neurologic deficits are appreciated.  Skin:  Skin is warm, dry and intact. No rash noted.  ____________________________________________    LABS (pertinent positives/negatives)  Labs  Reviewed  COMPREHENSIVE METABOLIC PANEL - Abnormal; Notable for the following:       Result Value   CO2 20 (*)    Glucose, Bld 128 (*)    All other components within normal limits  URINALYSIS COMPLETEWITH MICROSCOPIC ([facility] ONLY) - Abnormal; Notable for the following:    Color, Urine YELLOW (*)    APPearance HAZY (*)    Ketones, ur 2+ (*)    Hgb urine dipstick 2+ (*)    Protein, ur 30 (*)    Leukocytes, UA 2+ (*)    Squamous Epithelial / LPF 6-30 (*)    All other components within normal limits  CBC WITH DIFFERENTIAL/PLATELET - Abnormal; Notable for the following:    WBC 28.9 (*)    Neutro Abs 27.4 (*)    Lymphs Abs 0.6 (*)    All other components within normal limits  CULTURE, BLOOD (ROUTINE X 2)  CULTURE, BLOOD (ROUTINE X 2)  URINE CULTURE  PREGNANCY, URINE  LACTIC ACID, PLASMA  LACTIC ACID, PLASMA   Lactic acid pending at time of sign out  ____________________________________________   EKG  None  ____________________________________________    RADIOLOGY  CT abd/pel pending  ___________________________________________   PROCEDURES  Procedures  ____________________________________________   INITIAL IMPRESSION / ASSESSMENT AND PLAN / ED COURSE  Pertinent labs & imaging results that were available during my care of the patient were reviewed by me and considered in my medical decision making (see chart for details).  Patient presented with abdominal pain. UA with multiple WBCs concerning for UTI. However given patient's diffuse abdominal pain and tenderness will get CT abd/pel. Will go ahead and give IVFs and IV rocephin. ____________________________________________   FINAL CLINICAL IMPRESSION(S) / ED DIAGNOSES  UTI Abdominal pain  Note: This dictation was prepared with Dragon dictation. Any transcriptional errors that result from this process are unintentional    Phineas SemenGraydon Ezreal Turay, MD 07/26/16 2043

## 2016-07-26 NOTE — Progress Notes (Signed)
Pharmacy Antibiotic Note  Sydney BealsMelissa B Giles is a 40 y.o. female admitted on 07/26/2016 with UTI.  Pharmacy has been consulted for ceftriaxone dosing.  Plan: Ceftriaxone 1 gm IV Q24H  Height: 5\' 6"  (167.6 cm) Weight: 199 lb (90.3 kg) IBW/kg (Calculated) : 59.3  Temp (24hrs), Avg:100.3 F (37.9 C), Min:98.6 F (37 C), Max:102.4 F (39.1 C)   Recent Labs Lab 07/26/16 1847 07/26/16 2116  WBC 28.9*  --   CREATININE 0.78  --   LATICACIDVEN  --  1.1    Estimated Creatinine Clearance: 105.8 mL/min (by C-G formula based on SCr of 0.78 mg/dL).    No Known Allergies  Thank you for allowing pharmacy to be a part of this patient's care.  Carola FrostNathan A Mililani Murthy, Pharm.D., BCPS Clinical Pharmacist 07/26/2016 11:49 PM

## 2016-07-26 NOTE — ED Notes (Signed)
Hospitalist at bedside 

## 2016-07-27 ENCOUNTER — Inpatient Hospital Stay: Payer: 59

## 2016-07-27 LAB — BLOOD CULTURE ID PANEL (REFLEXED)
Acinetobacter baumannii: NOT DETECTED
CANDIDA GLABRATA: NOT DETECTED
CANDIDA KRUSEI: NOT DETECTED
CANDIDA PARAPSILOSIS: NOT DETECTED
Candida albicans: NOT DETECTED
Candida tropicalis: NOT DETECTED
ENTEROBACTER CLOACAE COMPLEX: NOT DETECTED
ESCHERICHIA COLI: NOT DETECTED
Enterobacteriaceae species: NOT DETECTED
Enterococcus species: NOT DETECTED
Haemophilus influenzae: NOT DETECTED
KLEBSIELLA OXYTOCA: NOT DETECTED
Klebsiella pneumoniae: NOT DETECTED
LISTERIA MONOCYTOGENES: NOT DETECTED
Neisseria meningitidis: NOT DETECTED
PROTEUS SPECIES: NOT DETECTED
PSEUDOMONAS AERUGINOSA: NOT DETECTED
SERRATIA MARCESCENS: NOT DETECTED
STAPHYLOCOCCUS AUREUS BCID: NOT DETECTED
STAPHYLOCOCCUS SPECIES: NOT DETECTED
STREPTOCOCCUS PNEUMONIAE: DETECTED — AB
STREPTOCOCCUS PYOGENES: NOT DETECTED
Streptococcus agalactiae: NOT DETECTED
Streptococcus species: DETECTED — AB

## 2016-07-27 LAB — CBC
HCT: 39.4 % (ref 35.0–47.0)
HEMOGLOBIN: 12.9 g/dL (ref 12.0–16.0)
MCH: 31.1 pg (ref 26.0–34.0)
MCHC: 32.8 g/dL (ref 32.0–36.0)
MCV: 94.8 fL (ref 80.0–100.0)
PLATELETS: 192 10*3/uL (ref 150–440)
RBC: 4.15 MIL/uL (ref 3.80–5.20)
RDW: 13.3 % (ref 11.5–14.5)
WBC: 19 10*3/uL — AB (ref 3.6–11.0)

## 2016-07-27 LAB — BASIC METABOLIC PANEL
ANION GAP: 6 (ref 5–15)
BUN: 7 mg/dL (ref 6–20)
CHLORIDE: 108 mmol/L (ref 101–111)
CO2: 22 mmol/L (ref 22–32)
Calcium: 7.5 mg/dL — ABNORMAL LOW (ref 8.9–10.3)
Creatinine, Ser: 0.76 mg/dL (ref 0.44–1.00)
GFR calc non Af Amer: >60 mL/min (ref >60)
Glucose, Bld: 134 mg/dL — ABNORMAL HIGH (ref 65–99)
POTASSIUM: 3.1 mmol/L — AB (ref 3.5–5.1)
SODIUM: 136 mmol/L (ref 135–145)

## 2016-07-27 MED ORDER — FLUCONAZOLE 100 MG PO TABS
150.0000 mg | ORAL_TABLET | Freq: Every day | ORAL | Status: DC
  Administered 2016-07-27 – 2016-07-28 (×2): 150 mg via ORAL
  Filled 2016-07-27 (×2): qty 2

## 2016-07-27 MED ORDER — POTASSIUM CHLORIDE CRYS ER 20 MEQ PO TBCR
40.0000 meq | EXTENDED_RELEASE_TABLET | ORAL | Status: AC
  Administered 2016-07-27 (×2): 40 meq via ORAL
  Filled 2016-07-27 (×2): qty 2

## 2016-07-27 MED ORDER — METAXALONE 400 MG HALF TABLET: 400.0000 mg | ORAL_TABLET | Freq: Two times a day (BID) | ORAL | Status: DC

## 2016-07-27 MED ORDER — CITALOPRAM HYDROBROMIDE 20 MG PO TABS: 20.0000 mg | ORAL_TABLET | Freq: Every day | ORAL | Status: DC

## 2016-07-27 MED ORDER — CEFTRIAXONE SODIUM-DEXTROSE 1-3.74 GM-% IV SOLR
1.0000 g | Freq: Once | INTRAVENOUS | Status: AC
  Administered 2016-07-27: 1 g via INTRAVENOUS
  Filled 2016-07-27: qty 50

## 2016-07-27 MED ORDER — CEFTRIAXONE SODIUM-DEXTROSE 2-2.22 GM-% IV SOLR
2.0000 g | INTRAVENOUS | Status: DC
  Administered 2016-07-28: 08:00:00 2 g via INTRAVENOUS
  Filled 2016-07-27: qty 50

## 2016-07-27 MED ORDER — METAXALONE 400 MG HALF TABLET
400.0000 mg | ORAL_TABLET | Freq: Two times a day (BID) | ORAL | Status: AC
  Administered 2016-07-27 – 2016-07-28 (×3): 400 mg via ORAL
  Filled 2016-07-27 (×3): qty 1

## 2016-07-27 MED ORDER — CITALOPRAM HYDROBROMIDE 20 MG PO TABS
20.0000 mg | ORAL_TABLET | Freq: Every day | ORAL | Status: DC
  Administered 2016-07-27 (×2): 20 mg via ORAL
  Filled 2016-07-27 (×2): qty 1

## 2016-07-27 MED ORDER — SODIUM CHLORIDE 0.9 % IV SOLN
INTRAVENOUS | Status: AC
  Administered 2016-07-27 (×2): via INTRAVENOUS

## 2016-07-27 NOTE — Progress Notes (Signed)
[facility] [place] [facility]   PATIENT NAME: Sydney Giles    MR#:  [id number]  DATE OF BIRTH:  [birth year]  SUBJECTIVE:  CHIEF COMPLAINT:   Chief Complaint  Patient presents with  . Abdominal Pain  . Nausea  . Emesis  . Fever   - flank pain is better, complains of neck pain - high grade temp of 102.53F last night, improved this morning - blood cultures positive  REVIEW OF SYSTEMS:  Review of Systems  Constitutional: Positive for fever. Negative for chills and malaise/fatigue.  HENT: Negative for ear discharge, ear pain and tinnitus.   Eyes: Negative for blurred vision and double vision.  Respiratory: Negative for shortness of breath and wheezing.   Cardiovascular: Negative for chest pain, palpitations and leg swelling.  Gastrointestinal: Negative for abdominal pain, constipation, diarrhea, nausea and vomiting.  Genitourinary: Negative for dysuria and urgency.  Musculoskeletal: Positive for neck pain.  Neurological: Negative for dizziness, speech change, focal weakness, seizures and headaches.  Psychiatric/Behavioral: Negative for depression.    DRUG ALLERGIES:  No Known Allergies  VITALS:  Blood pressure 107/60, pulse 90, temperature 99.3 F (37.4 C), temperature source Oral, resp. rate 16, height 5\' 6"  (1.676 m), weight 91.9 kg (202 lb 9.6 oz), last menstrual period 06/26/2016, SpO2 98 %, unknown if currently breastfeeding.  PHYSICAL EXAMINATION:  Physical Exam  GENERAL:  40 y.o.-year-old patient lying in the bed with no acute distress.  EYES: Pupils equal, round, reactive to light and accommodation. No scleral icterus. Extraocular muscles intact.  HEENT: Head atraumatic, normocephalic. Oropharynx and nasopharynx clear.  NECK:  Supple, no jugular venous distention. No thyroid enlargement, no tenderness.  LUNGS: Normal breath sounds bilaterally, no wheezing, rales,rhonchi or crepitation. No use of accessory muscles of respiration. Decreased  bibasilar breath sounds CARDIOVASCULAR: S1, S2 normal. No murmurs, rubs, or gallops.  ABDOMEN: Soft, nontender, nondistended. Bowel sounds present. No organomegaly or mass.  EXTREMITIES: No pedal edema, cyanosis, or clubbing.  NEUROLOGIC: Cranial nerves II through XII are intact. Muscle strength 5/5 in all extremities. Sensation intact. Gait not checked.  PSYCHIATRIC: The patient is alert and oriented x 3.  SKIN: No obvious rash, lesion, or ulcer.    LABORATORY PANEL:   CBC  Recent Labs Lab 07/27/16 0403  WBC 19.0*  HGB 12.9  HCT 39.4  PLT 192   ------------------------------------------------------------------------------------------------------------------  Chemistries   Recent Labs Lab 07/26/16 1847 07/27/16 0403  NA 137 136  K 3.5 3.1*  CL 106 108  CO2 20* 22  GLUCOSE 128* 134*  BUN 8 7  CREATININE 0.78 0.76  CALCIUM 9.0 7.5*  AST 18  --   ALT 14  --   ALKPHOS 43  --   BILITOT 0.9  --    ------------------------------------------------------------------------------------------------------------------  Cardiac Enzymes No results for input(s): TROPONINI in the last 168 hours. ------------------------------------------------------------------------------------------------------------------  RADIOLOGY:  Dg Chest 2 View  Result Date: 07/27/2016 CLINICAL DATA:  Fever. EXAM: CHEST  2 VIEW COMPARISON:  None. FINDINGS: The heart size and mediastinal contours are within normal limits. No pneumothorax is noted. Minimal right basilar subsegmental atelectasis is noted. Mild left basilar opacity is noted concerning for pneumonia or atelectasis. Possible minimal pleural effusion may be present. The visualized skeletal structures are unremarkable. IMPRESSION: Minimal right basilar subsegmental atelectasis. Mild left basilar pneumonia or atelectasis. Electronically Signed   By: Lupita Raider, M.D.   On: 07/27/2016 11:54   Ct Abdomen Pelvis W Contrast  Result Date:  07/26/2016 CLINICAL DATA:  Fever and abdominal pain EXAM: CT ABDOMEN AND PELVIS WITH CONTRAST TECHNIQUE: Multidetector CT imaging of the abdomen and pelvis was performed using the standard protocol following bolus administration of intravenous contrast. CONTRAST:  ISOVUE-300 IOPAMIDOL (ISOVUE-300) INJECTION 61% COMPARISON:  None. FINDINGS: Lower chest: Hazy and linear areas of atelectasis in the right middle lobe and bilateral lower lobes. No pleural effusion. Heart size is normal. Distal esophagus unremarkable. Hepatobiliary: There is a 2.7 cm cyst within the posterior right hepatic lobe. No other focal liver abnormalities are present. No calcified gallstones. No biliary dilatation. Pancreas: Unremarkable. No pancreatic ductal dilatation or surrounding inflammatory changes. Spleen: Normal in size without focal abnormality. Adrenals/Urinary Tract: Adrenal glands are unremarkable. Kidneys are normal, without renal calculi, focal lesion, or hydronephrosis. Bladder is unremarkable. Stomach/Bowel: There is moderate dilatation of stomach. No evidence for small bowel obstruction. Slight thickening of the pylorus.Appendix not well identified, but no right lower quadrant inflammatory process. Vascular/Lymphatic: Non aneurysmal aorta. No pathologically enlarged retroperitoneal or mesenteric nodes. Reproductive: Uterus unremarkable. 4.1 cm possible septated cyst in the right adnexa. Other: No free air or free fluid. 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 IMPRESSION: 1. No CT evidence for acute intra-abdominal or pelvic pathology. 2. Simple hepatic cyst. 3. Possible septated cyst in the right adnexa. Correlation with nonemergent pelvic ultrasound may be obtained. 4. 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 Electronically Signed   By: Jasmine Pang M.D.   On: 07/26/2016 22:18    EKG:  No orders found for this or  any previous visit.  ASSESSMENT AND PLAN:   40 year old female with no significant past medical history presents to the hospital secondary to fevers and chills and noted to be in sepsis.  #1 sepsis-like from urinary tract infection. -Urine cultures are pending. Blood cultures are 4 bottles growing strep pneumococcus -Get chest x-ray to make sure there is no underlying pneumonia. -On Rocephin, dose adjusted. -Monitor fevers. WBC is improving.  #2 hypokalemia- being supplemented. -Follow up tomorrow  #3 right ovarian cyst-on noted on CT abdomen and pelvis. Likely incidental benign finding. Monitor  #4 DVT prophylaxis - subcutaneous Lovenox  #5 yeast infection-secondary to antibiotics. Diflucan for 3 days     All the records are reviewed and case discussed with Care Management/Social Workerr. Management plans discussed with the patient, family and they are in agreement.  CODE STATUS: Full Code  TOTAL TIME TAKING CARE OF THIS PATIENT: 37 minutes.   POSSIBLE D/C IN 1-2 DAYS, DEPENDING ON CLINICAL CONDITION.   Kimani Hovis M.D on 07/27/2016 at 12:50 PM  Between 7am to 6pm - Pager - [phone]  After 6pm go to [email or web address] - Social research officer, government  [facility]  Office  [phone]  CC: Primary care physician; No PCP Per Patient

## 2016-07-27 NOTE — Progress Notes (Signed)
PHARMACY - PHYSICIAN COMMUNICATION CRITICAL VALUE ALERT - BLOOD CULTURE IDENTIFICATION (BCID)  Results for orders placed or performed during the hospital encounter of 07/26/16  Blood Culture ID Panel (Reflexed) (Collected: 07/26/2016  8:44 PM)  Result Value Ref Range   Enterococcus species NOT DETECTED NOT DETECTED   Listeria monocytogenes NOT DETECTED NOT DETECTED   Staphylococcus species NOT DETECTED NOT DETECTED   Staphylococcus aureus NOT DETECTED NOT DETECTED   Streptococcus species DETECTED (A) NOT DETECTED   Streptococcus agalactiae NOT DETECTED NOT DETECTED   Streptococcus pneumoniae DETECTED (A) NOT DETECTED   Streptococcus pyogenes NOT DETECTED NOT DETECTED   Acinetobacter baumannii NOT DETECTED NOT DETECTED   Enterobacteriaceae species NOT DETECTED NOT DETECTED   Enterobacter cloacae complex NOT DETECTED NOT DETECTED   Escherichia coli NOT DETECTED NOT DETECTED   Klebsiella oxytoca NOT DETECTED NOT DETECTED   Klebsiella pneumoniae NOT DETECTED NOT DETECTED   Proteus species NOT DETECTED NOT DETECTED   Serratia marcescens NOT DETECTED NOT DETECTED   Haemophilus influenzae NOT DETECTED NOT DETECTED   Neisseria meningitidis NOT DETECTED NOT DETECTED   Pseudomonas aeruginosa NOT DETECTED NOT DETECTED   Candida albicans NOT DETECTED NOT DETECTED   Candida glabrata NOT DETECTED NOT DETECTED   Candida krusei NOT DETECTED NOT DETECTED   Candida parapsilosis NOT DETECTED NOT DETECTED   Candida tropicalis NOT DETECTED NOT DETECTED    Name of physician (or Provider) Contacted: Kalisetti  Changes to prescribed antibiotics required: Discussed with MD. Currently on ceftriaxone 1gm IV Q24H, increased at this time to ceftriaxone 2gm IV Q24H.   Julie Paolini C 07/27/2016  10:19 AM

## 2016-07-28 LAB — CBC
HEMATOCRIT: 37.5 % (ref 35.0–47.0)
HEMOGLOBIN: 12.4 g/dL (ref 12.0–16.0)
MCH: 31 pg (ref 26.0–34.0)
MCHC: 33 g/dL (ref 32.0–36.0)
MCV: 94.1 fL (ref 80.0–100.0)
Platelets: 194 10*3/uL (ref 150–440)
RBC: 3.98 MIL/uL (ref 3.80–5.20)
RDW: 13.4 % (ref 11.5–14.5)
WBC: 6.3 10*3/uL (ref 3.6–11.0)

## 2016-07-28 LAB — URINE CULTURE

## 2016-07-28 MED ORDER — AZITHROMYCIN 250 MG PO TABS: 250.0000 mg | ORAL_TABLET | Freq: Every day | ORAL | 0 refills | Status: AC

## 2016-07-28 MED ORDER — SODIUM CHLORIDE 0.9 % IV SOLN: INTRAVENOUS | Status: DC

## 2016-07-28 MED ORDER — CEFPODOXIME PROXETIL 200 MG PO TABS: 200.0000 mg | ORAL_TABLET | Freq: Two times a day (BID) | ORAL | 0 refills | Status: AC

## 2016-07-28 MED ORDER — FLUCONAZOLE 150 MG PO TABS: 150.0000 mg | ORAL_TABLET | Freq: Every day | ORAL | 0 refills | Status: DC

## 2016-07-28 NOTE — Progress Notes (Signed)
Pt being discharged home, discharge instructions reviewed with pt and husband, states understanding, no complaints, no distress or discomfort noted

## 2016-07-28 NOTE — Progress Notes (Signed)
[facility] [address] [place], [place] [address]  July 28, 2016  Patient:  Leighton Mexicano Date of Birth: [birth year] Date of Visit:  07/26/2016  To Whom it May Concern:  Please excuse CYANNE FOGAL from work from 07/26/2016 until 07/28/16 as she was admitted to the [facility] for medical treatment and has been receiving appropriate care. She may return to work on 07/30/16, sooner if she feels she is able to return sooner than this date.      Please don't hesitate to contact me with questions or concerns by calling  [phone] and asking them to page me directly.   Marge Duncans, MD

## 2016-07-28 NOTE — Care Management (Signed)
Admitted to [facility] with the diagnosis of sepsis. Lives with husband. [facility]. Takes care of all basic and instrumental activities of daily living herself, drives. No falls. Fair appetite. Works at [employer] [place]  Discharge to home today per Dr. Clint GuyHower. Family will transport.  Gwenette GreetBrenda S Rishik Tubby RN MSN CCM Care Management [phone]

## 2016-07-28 NOTE — Discharge Summary (Signed)
[employer] - [place] at [facility]   PATIENT NAME: Sydney Giles    MR#:  [id number]  DATE OF BIRTH:  Apr 22, 1976  DATE OF ADMISSION:  07/26/2016 ADMITTING PHYSICIAN: Oralia Manis, MD  DATE OF DISCHARGE: 07/28/16  PRIMARY CARE PHYSICIAN: No PCP Per Patient    ADMISSION DIAGNOSIS:  Pyelonephritis [N12]  DISCHARGE DIAGNOSIS:  Principal Problem:   Sepsis (HCC) Active Problems:   Pyelonephritis   Ovarian cyst strep pneumoniae bacteremia   SECONDARY DIAGNOSIS:   Past Medical History:  Diagnosis Date  . Abnormal Pap smear   . No pertinent past medical history   . Ovarian cyst     HOSPITAL COURSE:  Sydney Giles  is a 40 y.o. female admitted 07/26/2016 with chief complaint Abdominal Pain; Nausea; Emesis; and Fever . Please see H&P performed by Oralia Manis, MD for further information. Patient presented with the above symptoms, meeting septic criteria on admission. Workup revealed possible pyelonephritis. Blood cultures returned positive for strep pneumoniae. Chest xray then performed revealing possible pneumonia. She greatly improved while inpatient and will be discharged to finish the rest of her antibiotic course.  DISCHARGE CONDITIONS:   stable  CONSULTS OBTAINED:    DRUG ALLERGIES:  No Known Allergies  DISCHARGE MEDICATIONS:   Current Discharge Medication List    START taking these medications   Details  azithromycin (ZITHROMAX) 250 MG tablet Take 1 tablet (250 mg total) by mouth daily. Qty: 4 tablet, Refills: 0    cefpodoxime (VANTIN) 200 MG tablet Take 1 tablet (200 mg total) by mouth 2 (two) times daily. Qty: 16 tablet, Refills: 0    fluconazole (DIFLUCAN) 150 MG tablet Take 1 tablet (150 mg total) by mouth daily. Qty: 3 tablet, Refills: 0      CONTINUE these medications which have NOT CHANGED   Details  citalopram (CELEXA) 20 MG tablet Take 20 mg by mouth daily.    JUNEL FE 1/20 1-20 MG-MCG tablet Take 1 tablet by mouth  daily.         DISCHARGE INSTRUCTIONS:    DIET:  Regular diet  DISCHARGE CONDITION:  Stable  ACTIVITY:  Activity as tolerated  OXYGEN:  Home Oxygen: No.   Oxygen Delivery: room air  DISCHARGE LOCATION:  home   If you experience worsening of your admission symptoms, develop shortness of breath, life threatening emergency, suicidal or homicidal thoughts you must seek medical attention immediately by calling 911 or calling your MD immediately  if symptoms less severe.  You Must read complete instructions/literature along with all the possible adverse reactions/side effects for all the Medicines you take and that have been prescribed to you. Take any new Medicines after you have completely understood and accpet all the possible adverse reactions/side effects.   Please note  You were cared for by a hospitalist during your hospital stay. If you have any questions about your discharge medications or the care you received while you were in the hospital after you are discharged, you can call the unit and asked to speak with the hospitalist on call if the hospitalist that took care of you is not available. Once you are discharged, your primary care physician will handle any further medical issues. Please note that NO REFILLS for any discharge medications will be authorized once you are discharged, as it is imperative that you return to your primary care physician (or establish a relationship with a primary care physician if you do not have one) for your aftercare needs so that they  can reassess your need for medications and monitor your lab values.    On the day of Discharge:   VITAL SIGNS:  Blood pressure 114/65, pulse 81, temperature 98.2 F (36.8 C), temperature source Oral, resp. rate 18, height 5\' 6"  (1.676 m), weight 91.9 kg (202 lb 9.6 oz), last menstrual period 06/26/2016, SpO2 97 %, unknown if currently breastfeeding.  I/O:   Intake/Output Summary (Last 24 hours) at  07/28/16 0834 Last data filed at 07/27/16 2210  Gross per 24 hour  Intake            897.5 ml  Output                0 ml  Net            897.5 ml    PHYSICAL EXAMINATION:  GENERAL:  40 y.o.-year-old patient lying in the bed with no acute distress.  EYES: Pupils equal, round, reactive to light and accommodation. No scleral icterus. Extraocular muscles intact.  HEENT: Head atraumatic, normocephalic. Oropharynx and nasopharynx clear.  NECK:  Supple, no jugular venous distention. No thyroid enlargement, no tenderness.  LUNGS: Normal breath sounds bilaterally, no wheezing, rales,rhonchi or crepitation. No use of accessory muscles of respiration.  CARDIOVASCULAR: S1, S2 normal. No murmurs, rubs, or gallops.  ABDOMEN: Soft, non-tender, non-distended. Bowel sounds present. No organomegaly or mass.  EXTREMITIES: No pedal edema, cyanosis, or clubbing.  NEUROLOGIC: Cranial nerves II through XII are intact. Muscle strength 5/5 in all extremities. Sensation intact. Gait not checked.  PSYCHIATRIC: The patient is alert and oriented x 3.  SKIN: No obvious rash, lesion, or ulcer.   DATA REVIEW:   CBC  Recent Labs Lab 07/28/16 0749  WBC 6.3  HGB 12.4  HCT 37.5  PLT 194    Chemistries   Recent Labs Lab 07/26/16 1847 07/27/16 0403  NA 137 136  K 3.5 3.1*  CL 106 108  CO2 20* 22  GLUCOSE 128* 134*  BUN 8 7  CREATININE 0.78 0.76  CALCIUM 9.0 7.5*  AST 18  --   ALT 14  --   ALKPHOS 43  --   BILITOT 0.9  --     Cardiac Enzymes No results for input(s): TROPONINI in the last 168 hours.  Microbiology Results  Results for orders placed or performed during the hospital encounter of 07/26/16  Blood culture (routine x 2)     Status: None (Preliminary result)   Collection Time: 07/26/16  8:44 PM  Result Value Ref Range Status   Specimen Description BLOOD BLOOD RIGHT WRIST  Final   Special Requests   Final    BOTTLES DRAWN AEROBIC AND ANAEROBIC Performed at [facility]     Culture  Setup Time   Final    Organism ID to follow GRAM POSITIVE COCCI IN BOTH AEROBIC AND ANAEROBIC BOTTLES CRITICAL RESULT CALLED TO, READ BACK BY AND VERIFIED WITH: CHRISTINE KATSOUDIS AT 1610 07/27/16 SDR    Culture GRAM POSITIVE COCCI  Final   Report Status PENDING  Incomplete  Blood Culture ID Panel (Reflexed)     Status: Abnormal   Collection Time: 07/26/16  8:44 PM  Result Value Ref Range Status   Enterococcus species NOT DETECTED NOT DETECTED Final   Listeria monocytogenes NOT DETECTED NOT DETECTED Final   Staphylococcus species NOT DETECTED NOT DETECTED Final   Staphylococcus aureus NOT DETECTED NOT DETECTED Final   Streptococcus species DETECTED (A) NOT DETECTED Final    Comment: CRITICAL RESULT CALLED  TO, READ BACK BY AND VERIFIED WITH:  CHRISTINE KATSOUDIS AT 2440 07/27/16 SDR    Streptococcus agalactiae NOT DETECTED NOT DETECTED Final   Streptococcus pneumoniae DETECTED (A) NOT DETECTED Final    Comment: CRITICAL RESULT CALLED TO, READ BACK BY AND VERIFIED WITH:  CHRISTINE KATSOUDIS AT 0955 07/27/16 SDR    Streptococcus pyogenes NOT DETECTED NOT DETECTED Final   Acinetobacter baumannii NOT DETECTED NOT DETECTED Final   Enterobacteriaceae species NOT DETECTED NOT DETECTED Final   Enterobacter cloacae complex NOT DETECTED NOT DETECTED Final   Escherichia coli NOT DETECTED NOT DETECTED Final   Klebsiella oxytoca NOT DETECTED NOT DETECTED Final   Klebsiella pneumoniae NOT DETECTED NOT DETECTED Final   Proteus species NOT DETECTED NOT DETECTED Final   Serratia marcescens NOT DETECTED NOT DETECTED Final   Haemophilus influenzae NOT DETECTED NOT DETECTED Final   Neisseria meningitidis NOT DETECTED NOT DETECTED Final   Pseudomonas aeruginosa NOT DETECTED NOT DETECTED Final   Candida albicans NOT DETECTED NOT DETECTED Final   Candida glabrata NOT DETECTED NOT DETECTED Final   Candida krusei NOT DETECTED NOT DETECTED Final   Candida parapsilosis NOT DETECTED NOT  DETECTED Final   Candida tropicalis NOT DETECTED NOT DETECTED Final  Blood culture (routine x 2)     Status: None (Preliminary result)   Collection Time: 07/26/16  8:45 PM  Result Value Ref Range Status   Specimen Description BLOOD RIGHT ARM  Final   Special Requests 10CC Performed at [facility]   Final   Culture  Setup Time   Final    GRAM POSITIVE COCCI IN BOTH AEROBIC AND ANAEROBIC BOTTLES CRTPRC SDR    Culture GRAM POSITIVE COCCI  Final   Report Status PENDING  Incomplete    RADIOLOGY:  Dg Chest 2 View  Result Date: 07/27/2016 CLINICAL DATA:  Fever. EXAM: CHEST  2 VIEW COMPARISON:  None. FINDINGS: The heart size and mediastinal contours are within normal limits. No pneumothorax is noted. Minimal right basilar subsegmental atelectasis is noted. Mild left basilar opacity is noted concerning for pneumonia or atelectasis. Possible minimal pleural effusion may be present. The visualized skeletal structures are unremarkable. IMPRESSION: Minimal right basilar subsegmental atelectasis. Mild left basilar pneumonia or atelectasis. Electronically Signed   By: Lupita Raider, M.D.   On: 07/27/2016 11:54   Ct Abdomen Pelvis W Contrast  Result Date: 07/26/2016 CLINICAL DATA:  Fever and abdominal pain EXAM: CT ABDOMEN AND PELVIS WITH CONTRAST TECHNIQUE: Multidetector CT imaging of the abdomen and pelvis was performed using the standard protocol following bolus administration of intravenous contrast. CONTRAST:  ISOVUE-300 IOPAMIDOL (ISOVUE-300) INJECTION 61% COMPARISON:  None. FINDINGS: Lower chest: Hazy and linear areas of atelectasis in the right middle lobe and bilateral lower lobes. No pleural effusion. Heart size is normal. Distal esophagus unremarkable. Hepatobiliary: There is a 2.7 cm cyst within the posterior right hepatic lobe. No other focal liver abnormalities are present. No calcified gallstones. No biliary dilatation. Pancreas: Unremarkable. No pancreatic ductal  dilatation or surrounding inflammatory changes. Spleen: Normal in size without focal abnormality. Adrenals/Urinary Tract: Adrenal glands are unremarkable. Kidneys are normal, without renal calculi, focal lesion, or hydronephrosis. Bladder is unremarkable. Stomach/Bowel: There is moderate dilatation of stomach. No evidence for small bowel obstruction. Slight thickening of the pylorus.Appendix not well identified, but no right lower quadrant inflammatory process. Vascular/Lymphatic: Non aneurysmal aorta. No pathologically enlarged retroperitoneal or mesenteric nodes. Reproductive: Uterus unremarkable. 4.1 cm possible septated cyst in the right adnexa. Other: No free air  or free fluid. 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 IMPRESSION: 1. No CT evidence for acute intra-abdominal or pelvic pathology. 2. Simple hepatic cyst. 3. Possible septated cyst in the right adnexa. Correlation with nonemergent pelvic ultrasound may be obtained. 4. 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 Electronically Signed   By: Jasmine Pang M.D.   On: 07/26/2016 22:18     Management plans discussed with the patient, family and they are in agreement.  CODE STATUS:     Code Status Orders        Start     Ordered   07/26/16 2345  Full code  Continuous     07/26/16 2344    Code Status History    Date Active Date Inactive Code Status Order ID Comments User Context   05/13/2012  8:01 PM 05/15/2012  1:18 PM Full Code [id number]  Maura Crandall, RN Inpatient      TOTAL TIME TAKING CARE OF THIS PATIENT: 33 minutes.    Hower,  Mardi Mainland.D on 07/28/2016 at 8:34 AM  Between 7am to 6pm - Pager - [phone]  After 6pm go to [email or web address] - Scientist, research (life sciences) [place] [facility]  Office  [phone]  CC: Primary care physician; No PCP Per Patient

## 2016-07-29 LAB — CULTURE, BLOOD (ROUTINE X 2)

## 2017-05-22 DIAGNOSIS — Z6832 Body mass index (BMI) 32.0-32.9, adult: Secondary | ICD-10-CM | POA: Diagnosis not present

## 2017-05-22 DIAGNOSIS — Z01419 Encounter for gynecological examination (general) (routine) without abnormal findings: Secondary | ICD-10-CM | POA: Diagnosis not present

## 2017-05-22 DIAGNOSIS — Z1231 Encounter for screening mammogram for malignant neoplasm of breast: Secondary | ICD-10-CM | POA: Diagnosis not present

## 2017-10-09 DIAGNOSIS — Z6832 Body mass index (BMI) 32.0-32.9, adult: Secondary | ICD-10-CM | POA: Diagnosis not present

## 2017-10-09 DIAGNOSIS — J019 Acute sinusitis, unspecified: Secondary | ICD-10-CM | POA: Diagnosis not present

## 2019-11-18 ENCOUNTER — Other Ambulatory Visit: Payer: Self-pay | Admitting: Obstetrics and Gynecology

## 2019-11-18 DIAGNOSIS — R928 Other abnormal and inconclusive findings on diagnostic imaging of breast: Secondary | ICD-10-CM

## 2019-11-20 ENCOUNTER — Ambulatory Visit
Admission: RE | Admit: 2019-11-20 | Discharge: 2019-11-20 | Disposition: A | Discharge status: Home / Self Care | Payer: 59 | Source: Ambulatory Visit | Attending: Obstetrics and Gynecology | Admitting: Obstetrics and Gynecology

## 2019-11-20 ENCOUNTER — Other Ambulatory Visit: Payer: Self-pay

## 2019-11-20 DIAGNOSIS — R928 Other abnormal and inconclusive findings on diagnostic imaging of breast: Secondary | ICD-10-CM

## 2020-09-25 IMAGING — MG MM DIGITAL DIAGNOSTIC UNILAT*R* W/ TOMO W/ CAD
4 series · 4 of 12 positions shown · non-contrast
Comparison: 11/13/2019 and earlier

CLINICAL DATA: Patient returns for further evaluation of possible
RIGHT breast mass after screening study.

EXAM:
DIGITAL DIAGNOSTIC RIGHT MAMMOGRAM WITH CAD AND TOMO
ULTRASOUND RIGHT BREAST

[R CC synth-2D]
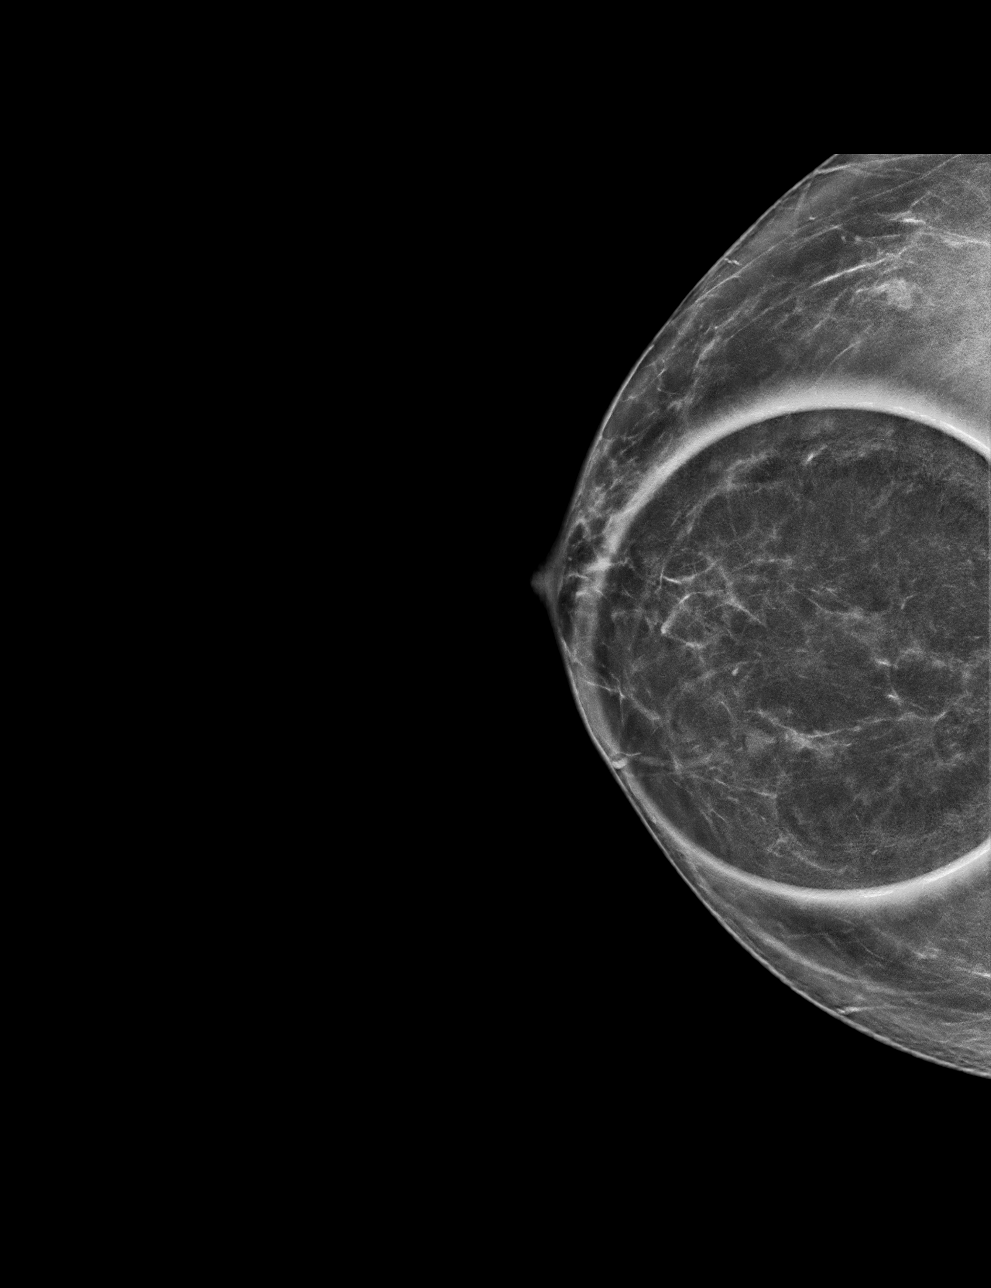

[R MLO synth-2D]
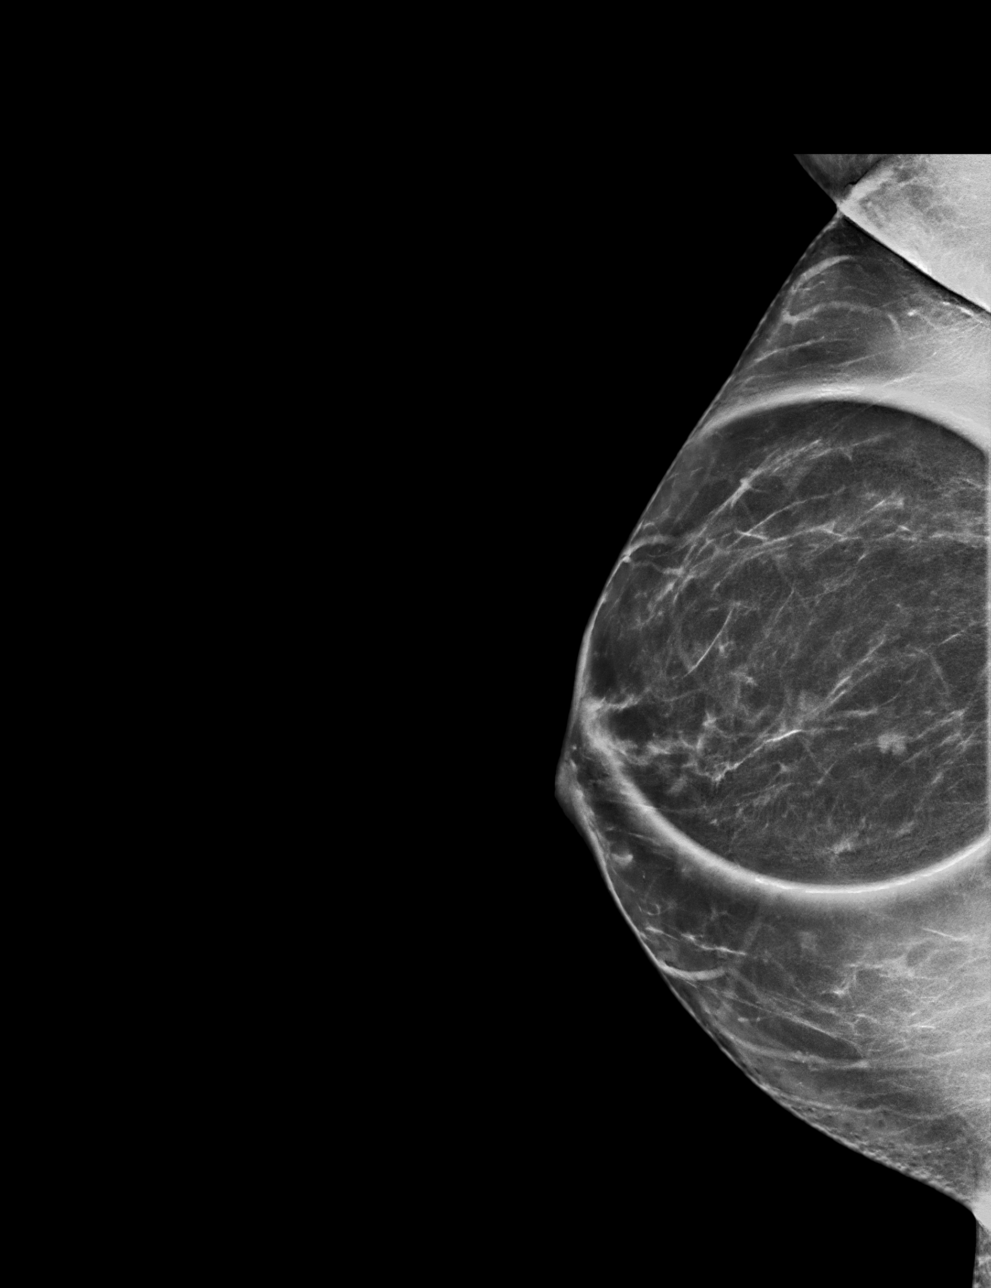

[R MLO tomo · tomo slice 37/72.0]
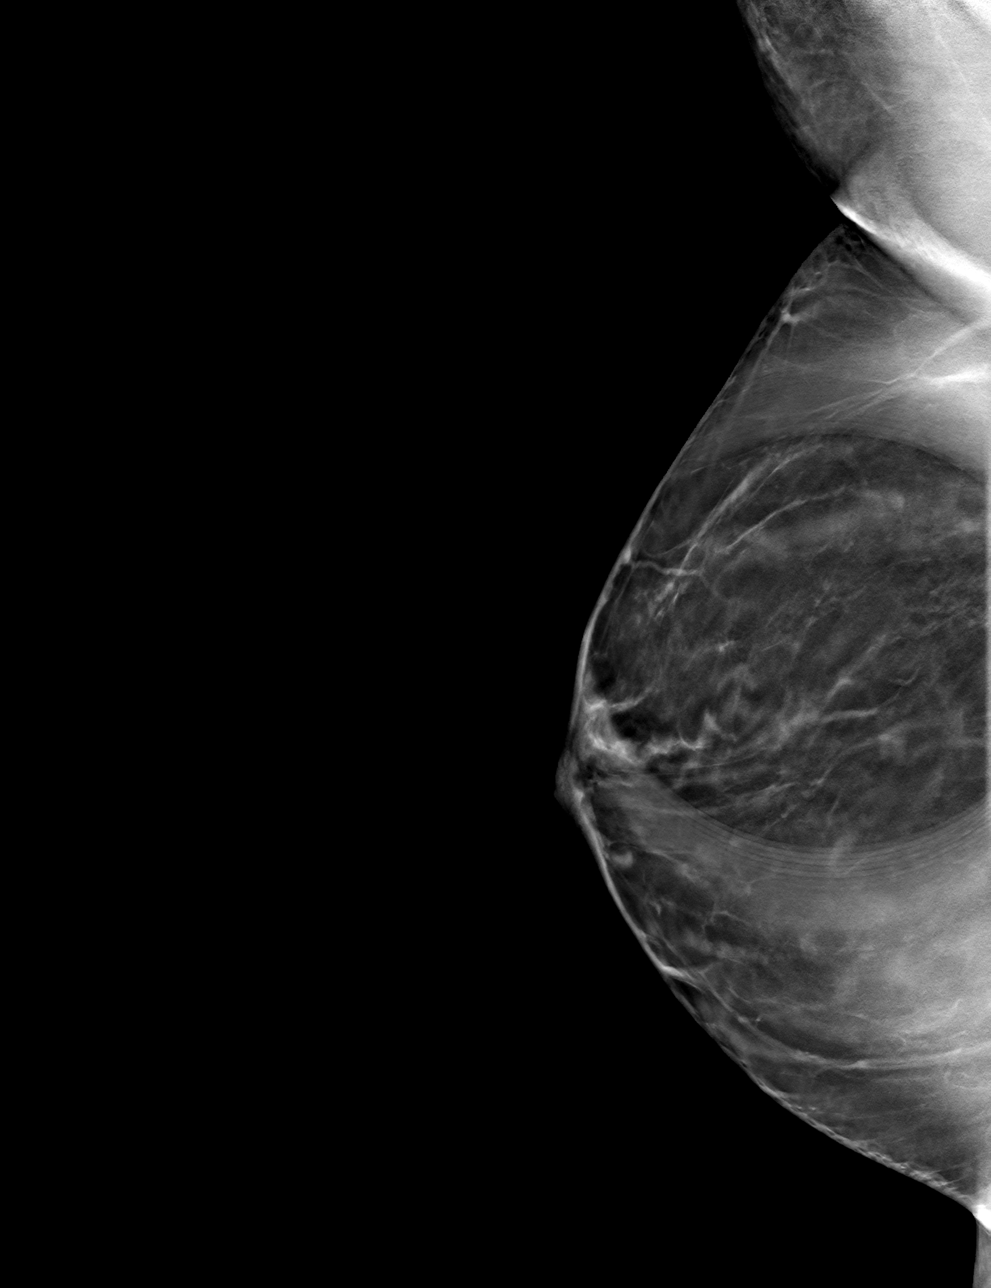

[R CC tomo · tomo slice 35/69.0]
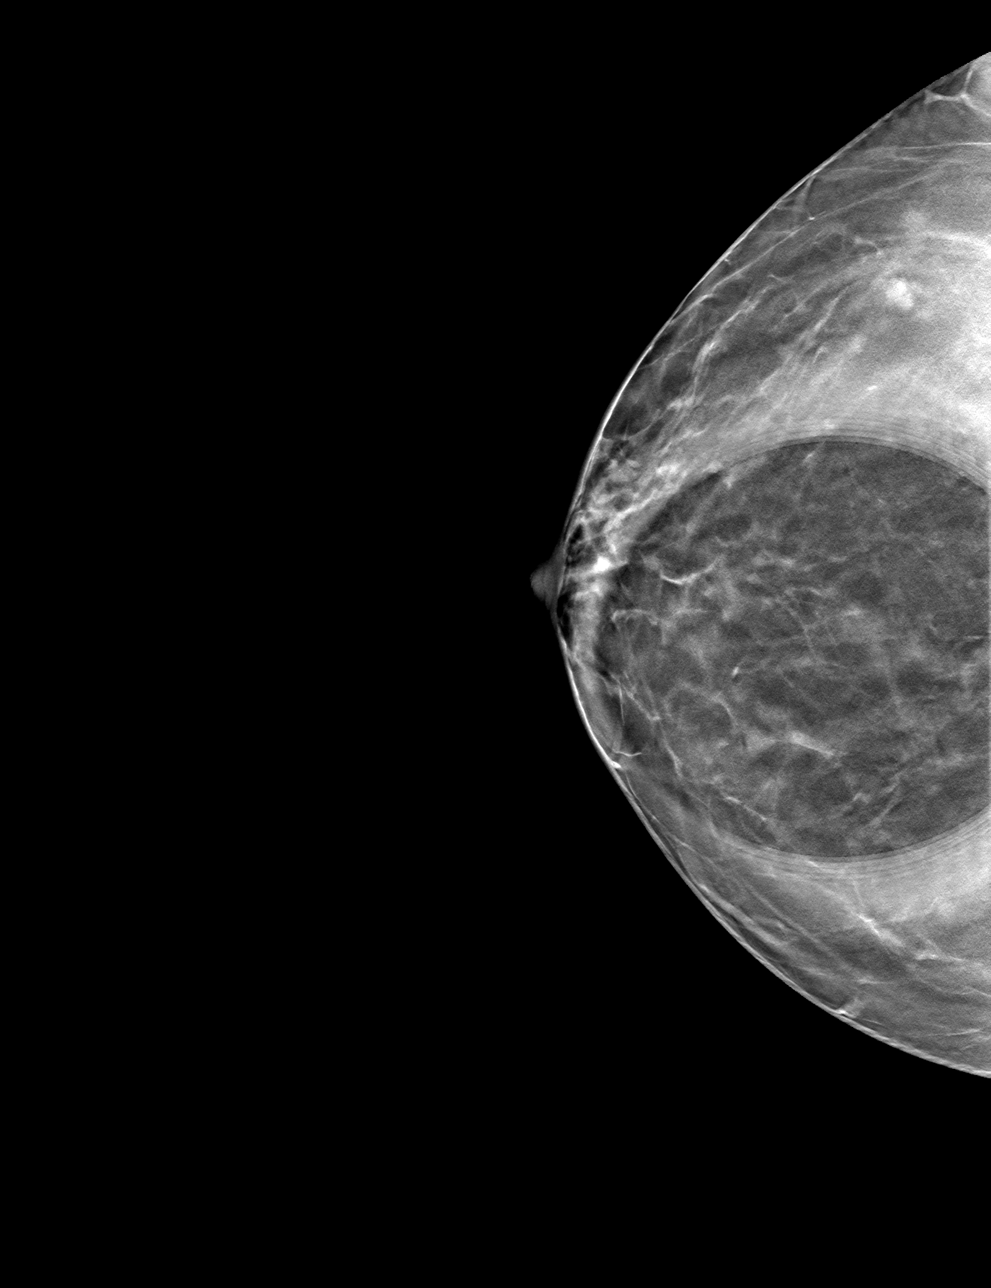

[4 of 12 positions shown; findings below may reference images not displayed]

ACR Breast Density Category b: There are scattered areas of
fibroglandular density.
FINDINGS: Additional 2-D and 3-D images are performed. These views confirm
presence of a circumscribed mass in the UPPER INNER QUADRANT of the
RIGHT breast.

Mammographic images were processed with CAD.

Targeted ultrasound is performed, showing a simple cyst in the 1
o'clock location of the RIGHT breast 3 centimeters from the nipple
which measures 0.4 x 0.3 x 0.4 centimeters. No solid component or
areas of acoustic shadowing.
IMPRESSION: Benign cyst in the RIGHT breast. No mammographic or ultrasound
evidence for malignancy.

RECOMMENDATION:
Screening mammogram in one year.(Code:[id number])

I have discussed the findings and recommendations with the patient.
If applicable, a reminder letter will be sent to the patient
regarding the next appointment.

BI-RADS CATEGORY  2: Benign.

## 2022-05-02 ENCOUNTER — Encounter: Payer: Self-pay | Admitting: *Deleted

## 2022-05-09 ENCOUNTER — Encounter: Payer: Self-pay | Admitting: Cardiology

## 2022-05-09 ENCOUNTER — Ambulatory Visit: Payer: 59 | Admitting: Cardiology

## 2022-05-09 ENCOUNTER — Other Ambulatory Visit
Admission: RE | Admit: 2022-05-09 | Discharge: 2022-05-09 | Disposition: A | Discharge status: Home / Self Care | Payer: 59 | Source: Ambulatory Visit | Attending: Cardiology | Admitting: Cardiology

## 2022-05-09 VITALS — BP 132/86 | HR 70 | Ht 66.0 in | Wt 227.0 lb

## 2022-05-09 DIAGNOSIS — R072 Precordial pain: Secondary | ICD-10-CM

## 2022-05-09 DIAGNOSIS — Z6836 Body mass index (BMI) 36.0-36.9, adult: Secondary | ICD-10-CM

## 2022-05-09 DIAGNOSIS — R0600 Dyspnea, unspecified: Secondary | ICD-10-CM | POA: Diagnosis not present

## 2022-05-09 LAB — BASIC METABOLIC PANEL
Anion gap: 7 (ref 5–15)
BUN: 12 mg/dL (ref 6–20)
CO2: 20 mmol/L — ABNORMAL LOW (ref 22–32)
Calcium: 9 mg/dL (ref 8.9–10.3)
Chloride: 110 mmol/L (ref 98–111)
Creatinine, Ser: 0.69 mg/dL (ref 0.44–1.00)
GFR, Estimated: >60 mL/min (ref >60)
Glucose, Bld: 100 mg/dL — ABNORMAL HIGH (ref 70–99)
Potassium: 4 mmol/L (ref 3.5–5.1)
Sodium: 137 mmol/L (ref 135–145)

## 2022-05-09 MED ORDER — IVABRADINE HCL 5 MG PO TABS: ORAL_TABLET | ORAL | 0 refills | Status: DC

## 2022-05-09 MED ORDER — METOPROLOL TARTRATE 100 MG PO TABS: ORAL_TABLET | ORAL | 0 refills | Status: DC

## 2022-05-09 NOTE — Patient Instructions (Signed)
Medication Instructions:  Your physician recommends that you continue on your current medications as directed. Please refer to the Current Medication list given to you today.   A one time dose of Metoprolol 100 mg  and Ivabradine 10 mg to be taken 2 hours prior to your Cardiac CTA has been sent to your pharmacy.  *If you need a refill on your cardiac medications before your next appointment, please call your pharmacy*   Lab Work: [facility] Today  Please have your lab drawn at the [facility]. Stop at the Registration desk to check in.  If you have labs (blood work) drawn today and your tests are completely normal, you will receive your results only by: MyChart Message (if you have MyChart) OR A paper copy in the mail If you have any lab test that is abnormal or we need to change your treatment, we will call you to review the results.   Testing/Procedures: Your physician has requested that you have an echocardiogram. Echocardiography is a painless test that uses sound waves to create images of your heart. It provides your doctor with information about the size and shape of your heart and how well your heart's chambers and valves are working. This procedure takes approximately one hour. There are no restrictions for this procedure.  Your physician has requested that you have cardiac CT. Cardiac computed tomography (CT) is a painless test that uses an x-ray machine to take clear, detailed pictures of your heart. For further information please visit [email or web address] Please follow instruction sheet as given.     Follow-Up: At [facility], you and your health needs are our priority.  As part of our continuing mission to provide you with exceptional heart care, we have created designated Provider Care Teams.  These Care Teams include your primary Cardiologist (physician) and Advanced Practice Providers (APPs -  Physician Assistants and Nurse Practitioners) who all work together to provide  you with the care you need, when you need it.  We recommend signing up for the patient portal called "MyChart".  Sign up information is provided on this After Visit Summary.  MyChart is used to connect with patients for Virtual Visits (Telemedicine).  Patients are able to view lab/test results, encounter notes, upcoming appointments, etc.  Non-urgent messages can be sent to your provider as well.   To learn more about what you can do with MyChart, go to [email or web address].    Your next appointment:   After testing   The format for your next appointment:   In Person  Provider:   You may see Debbe Odea, MD or one of the following Advanced Practice Providers on your designated Care Team:   Nicolasa Ducking, NP Eula Listen, PA-C Cadence Fransico Michael, [place]   Other Instructions Your physician has requested that you have cardiac CT. Cardiac computed tomography (CT) is a painless test that uses an x-ray machine to take clear, detailed pictures of your heart.    Your cardiac CT will be scheduled at:  [facility] [address] [place], [place] [address] [phone]  Your test is scheduled on Mon 05/16/22 @ 8:45 am Please arrive 15 mins early for check-in and test prep.      Please follow these instructions carefully (unless otherwise directed):    On the Night Before the Test: Be sure to Drink plenty of water. Do not consume any caffeinated/decaffeinated beverages or chocolate 12 hours prior to your test.   On the Day of  the Test: Drink plenty of water until 1 hour prior to the test. Do not eat any food 4 hours prior to the test. You may take your regular medications prior to the test.  Take metoprolol (Lopressor) and Ivabradine two hours prior to test. FEMALES- please wear underwire-free bra if available, avoid dresses & tight clothing         After the Test: Drink plenty of water. After receiving IV contrast, you may  experience a mild flushed feeling. This is normal. On occasion, you may experience a mild rash up to 24 hours after the test. This is not dangerous. If this occurs, you can take Benadryl 25 mg and increase your fluid intake. If you experience trouble breathing, this can be serious. If it is severe call 911 IMMEDIATELY. If it is mild, please call our office. If you take any of these medications: Glipizide/Metformin, Avandament, Glucavance, please do not take 48 hours after completing test unless otherwise instructed.  Please allow 2-4 weeks for scheduling of routine cardiac CTs. Some insurance companies require a pre-authorization which may delay scheduling of this test.   For non-scheduling related questions, please contact the cardiac imaging nurse navigator should you have any questions/concerns: Rockwell Alexandria, Cardiac Imaging Nurse Navigator Larey Brick, Cardiac Imaging Nurse Navigator [facility] Direct Office Dial: [phone]   For scheduling needs, including cancellations and rescheduling, please call [place], [phone].    Important Information About Sugar

## 2022-05-09 NOTE — Progress Notes (Signed)
Cardiology Office Note:    Date:  05/09/2022   ID:  Sydney Giles, DOB 12/03/1975, MRN [id number]  PCP:  Ailene Ravel, MD   [facility] Providers Cardiologist:  None     Referring MD: Ailene Ravel, MD   Chief Complaint  Patient presents with   NEW patient-Referred by PCp for eval of L shoulder/arm pain    Symptoms have been intermittent over the last 9 months.    History of Present Illness:    Sydney Giles is a 46 y.o. female with no significant past medical history apart from being overweight who presents due to chest and shoulder pain radiating down left arm.  Patient states having symptoms of left shoulder upper chest left neck discomfort ongoing over the past 9 months.  Symptoms usually occur if patient is stressed or upset.  Last occurrence was when she received an email from work causing her to be stressed.  Endorses getting shortness of breath when she exerts herself or goes upstairs.  Denies any personal history of heart disease, denies smoking.  Denies edema.  Past Medical History:  Diagnosis Date   Abnormal Pap smear    No pertinent past medical history    Ovarian cyst     Past Surgical History:  Procedure Laterality Date   COLPOSCOPY     OVARIAN CYST SURGERY      Current Medications: Current Meds  Medication Sig   ivabradine (CORLANOR) 5 MG TABS tablet Take 2 tablets (10 mg) 2 hours prior to Cardiac CTA   JUNEL FE 1/20 1-20 MG-MCG tablet Take 1 tablet by mouth daily.   metoprolol tartrate (LOPRESSOR) 100 MG tablet Take 1 tablet (100 mg) 2 hours prior tot Cardiac CTA     Allergies:   Patient has no known allergies.   Social History   Socioeconomic History   Marital status: Married    Spouse name: Not on file   Number of children: Not on file   Years of education: Not on file   Highest education level: Not on file  Occupational History   Not on file  Tobacco Use   Smoking status: Never   Smokeless tobacco: Never  Vaping Use    Vaping Use: Never used  Substance and Sexual Activity   Alcohol use: No   Drug use: No   Sexual activity: Yes  Other Topics Concern   Not on file  Social History Narrative   Not on file   Social Determinants of Health   Financial Resource Strain: Not on file  Food Insecurity: Not on file  Transportation Needs: Not on file  Physical Activity: Not on file  Stress: Not on file  Social Connections: Not on file     Family History: The patient's family history includes Arthritis/Rheumatoid in her mother; Diabetes in her father.  ROS:   Please see the history of present illness.     All other systems reviewed and are negative.  EKGs/Labs/Other Studies Reviewed:    The following studies were reviewed today:   EKG:  EKG is  ordered today.  The ekg ordered today demonstrates normal sinus rhythm,  Recent Labs: No results found for requested labs within last 365 days.  Recent Lipid Panel    Component Value Date/Time   CHOL 184 04/18/2007 0000   TRIG 80 04/18/2007 0000   HDL 64 04/18/2007 0000   CHOLHDL 2.9 04/18/2007 0000   VLDL 16 04/18/2007 0000   LDLCALC 104 04/18/2007 0000  Risk Assessment/Calculations:          Physical Exam:    VS:  BP 132/86 (BP Location: Right Arm, Patient Position: Sitting, Cuff Size: Large)   Pulse 70   Ht 5\' 6"  (1.676 m)   Wt 227 lb (103 kg)   SpO2 98%   BMI 36.64 kg/m     Wt Readings from Last 3 Encounters:  05/09/22 227 lb (103 kg)  07/26/16 202 lb 9.6 oz (91.9 kg)  05/13/12 216 lb (98 kg)     GEN:  Well nourished, well developed in no acute distress HEENT: Normal NECK: No JVD; No carotid bruits CARDIAC: RRR, no murmurs, rubs, gallops RESPIRATORY:  Clear to auscultation without rales, wheezing or rhonchi  ABDOMEN: Soft, non-tender, non-distended MUSCULOSKELETAL:  No edema; No deformity  SKIN: Warm and dry NEUROLOGIC:  Alert and oriented x 3 PSYCHIATRIC:  Normal affect   ASSESSMENT:    1. Precordial pain   2.  Dyspnea, unspecified type   3. BMI 36.0-36.9,adult    PLAN:    In order of problems listed above:  Chest pain, shoulder and left neck pain associated with stress.  This could be an anginal equivalent.  Get echocardiogram, get coronary CTA Shortness of breath with exertion, echo and coronary CTA as above, deconditioning could be contributing. Obesity, low-calorie diet, weight loss advised.  Follow-up after echo and coronary CTA.     Medication Adjustments/Labs and Tests Ordered: Current medicines are reviewed at length with the patient today.  Concerns regarding medicines are outlined above.  Orders Placed This Encounter  Procedures   CT CORONARY MORPH W/CTA COR W/SCORE W/CA W/CM &/OR WO/CM   Basic metabolic panel   EKG 12-Lead   ECHOCARDIOGRAM COMPLETE   Meds ordered this encounter  Medications   metoprolol tartrate (LOPRESSOR) 100 MG tablet    Sig: Take 1 tablet (100 mg) 2 hours prior tot Cardiac CTA    Dispense:  1 tablet    Refill:  0   ivabradine (CORLANOR) 5 MG TABS tablet    Sig: Take 2 tablets (10 mg) 2 hours prior to Cardiac CTA    Dispense:  2 tablet    Refill:  0    Patient Instructions  Medication Instructions:  Your physician recommends that you continue on your current medications as directed. Please refer to the Current Medication list given to you today.   A one time dose of Metoprolol 100 mg  and Ivabradine 10 mg to be taken 2 hours prior to your Cardiac CTA has been sent to your pharmacy.  *If you need a refill on your cardiac medications before your next appointment, please call your pharmacy*   Lab Work: [facility] Today  Please have your lab drawn at the [facility]. Stop at the Registration desk to check in.  If you have labs (blood work) drawn today and your tests are completely normal, you will receive your results only by: MyChart Message (if you have MyChart) OR A paper copy in the mail If you have any lab test that is abnormal or we need  to change your treatment, we will call you to review the results.   Testing/Procedures: Your physician has requested that you have an echocardiogram. Echocardiography is a painless test that uses sound waves to create images of your heart. It provides your doctor with information about the size and shape of your heart and how well your heart's chambers and valves are working. This procedure takes approximately one hour. There  are no restrictions for this procedure.  Your physician has requested that you have cardiac CT. Cardiac computed tomography (CT) is a painless test that uses an x-ray machine to take clear, detailed pictures of your heart. For further information please visit [email or web address] Please follow instruction sheet as given.     Follow-Up: At [facility], you and your health needs are our priority.  As part of our continuing mission to provide you with exceptional heart care, we have created designated Provider Care Teams.  These Care Teams include your primary Cardiologist (physician) and Advanced Practice Providers (APPs -  Physician Assistants and Nurse Practitioners) who all work together to provide you with the care you need, when you need it.  We recommend signing up for the patient portal called "MyChart".  Sign up information is provided on this After Visit Summary.  MyChart is used to connect with patients for Virtual Visits (Telemedicine).  Patients are able to view lab/test results, encounter notes, upcoming appointments, etc.  Non-urgent messages can be sent to your provider as well.   To learn more about what you can do with MyChart, go to [email or web address].    Your next appointment:   After testing   The format for your next appointment:   In Person  Provider:   You may see Debbe Odea, MD or one of the following Advanced Practice Providers on your designated Care Team:   Nicolasa Ducking, NP Eula Listen, PA-C Cadence Fransico Michael, [place]   Other  Instructions Your physician has requested that you have cardiac CT. Cardiac computed tomography (CT) is a painless test that uses an x-ray machine to take clear, detailed pictures of your heart.    Your cardiac CT will be scheduled at:  [facility] [address] [place], [place] [address] [phone]  Your test is scheduled on Mon 05/16/22 @ 8:45 am Please arrive 15 mins early for check-in and test prep.      Please follow these instructions carefully (unless otherwise directed):    On the Night Before the Test: Be sure to Drink plenty of water. Do not consume any caffeinated/decaffeinated beverages or chocolate 12 hours prior to your test.   On the Day of the Test: Drink plenty of water until 1 hour prior to the test. Do not eat any food 4 hours prior to the test. You may take your regular medications prior to the test.  Take metoprolol (Lopressor) and Ivabradine two hours prior to test. FEMALES- please wear underwire-free bra if available, avoid dresses & tight clothing         After the Test: Drink plenty of water. After receiving IV contrast, you may experience a mild flushed feeling. This is normal. On occasion, you may experience a mild rash up to 24 hours after the test. This is not dangerous. If this occurs, you can take Benadryl 25 mg and increase your fluid intake. If you experience trouble breathing, this can be serious. If it is severe call 911 IMMEDIATELY. If it is mild, please call our office. If you take any of these medications: Glipizide/Metformin, Avandament, Glucavance, please do not take 48 hours after completing test unless otherwise instructed.  Please allow 2-4 weeks for scheduling of routine cardiac CTs. Some insurance companies require a pre-authorization which may delay scheduling of this test.   For non-scheduling related questions, please contact the cardiac imaging nurse navigator should you have  any questions/concerns: Rockwell Alexandria, Cardiac Imaging Nurse Navigator Larey Brick, Cardiac  Imaging Nurse Navigator [facility] Direct Office Dial: [phone]   For scheduling needs, including cancellations and rescheduling, please call [place], [phone].    Important Information About Sugar         Signed, Debbe Odea, MD  05/09/2022 9:41 AM    [facility]

## 2022-05-13 ENCOUNTER — Telehealth (HOSPITAL_COMMUNITY): Payer: Self-pay | Admitting: Emergency Medicine

## 2022-05-13 ENCOUNTER — Telehealth: Payer: Self-pay | Admitting: Cardiology

## 2022-05-13 NOTE — Telephone Encounter (Signed)
Pt c/o medication issue:  1. Name of Medication: ivabradine (CORLANOR) 5 MG TABS tablet  2. How are you currently taking this medication (dosage and times per day)? Not currently taking   3. Are you having a reaction (difficulty breathing--STAT)? No   4. What is your medication issue? Pharmacy stated PA is needed before she can pick up medication. Patient is wanting to know if her CT for Monday will have to be rescheduled due to this. Please advise.

## 2022-05-13 NOTE — Telephone Encounter (Signed)
Reaching out to patient to offer assistance regarding upcoming cardiac imaging study; pt verbalizes understanding of appt date/time, parking situation and where to check in, pre-test NPO status and medications ordered, and verified current allergies; name and call back number provided for further questions should they arise Rockwell Alexandria RN Navigator Cardiac Imaging [facility] [phone] office [phone] cell  Arrival 815  Pt concerned about cost of test. Has 100mg  metoprolol , getting 10mg  ivab from another pharm this afternoon. Denies iv issues Aware of nitro

## 2022-05-13 NOTE — Telephone Encounter (Signed)
Spoke with the patient. Advised her that the Corlanor 10 mg prescribed for her upcoming CCTA will not be covered by insurance. She will need to pay out of pocket for (2) 5 mg tablets. Adv the patient that cost vary by pharmacy. Adv the patient to contact the office if the out of pocket cost is an issue. Patient verbalized understanding.

## 2022-05-16 ENCOUNTER — Ambulatory Visit
Admission: RE | Admit: 2022-05-16 | Discharge: 2022-05-16 | Disposition: A | Discharge status: Home / Self Care | Payer: 59 | Source: Ambulatory Visit | Attending: Cardiology | Admitting: Cardiology

## 2022-05-16 DIAGNOSIS — R072 Precordial pain: Secondary | ICD-10-CM | POA: Diagnosis present

## 2022-05-16 MED ORDER — NITROGLYCERIN 0.4 MG SL SUBL
0.8000 mg | SUBLINGUAL_TABLET | Freq: Once | SUBLINGUAL | Status: AC
  Administered 2022-05-16: 0.8 mg via SUBLINGUAL

## 2022-05-16 MED ORDER — IOHEXOL 350 MG/ML SOLN
75.0000 mL | Freq: Once | INTRAVENOUS | Status: AC | PRN
  Administered 2022-05-16: 75 mL via INTRAVENOUS

## 2022-05-16 MED ORDER — NITROGLYCERIN 0.4 MG SL SUBL
SUBLINGUAL_TABLET | SUBLINGUAL | Status: AC
  Filled 2022-05-16: qty 2

## 2022-05-16 NOTE — Progress Notes (Signed)
Patient tolerated procedure well. Ambulate w/o difficulty. Denies light headedness or being dizzy. Sitting in chair. Encouraged to drink extra water today and reasoning explained. Verbalized understanding. All questions answered. ABC intact. No further needs. Discharge from procedure area w/o issues.   

## 2022-05-17 ENCOUNTER — Ambulatory Visit (INDEPENDENT_AMBULATORY_CARE_PROVIDER_SITE_OTHER): Payer: 59

## 2022-05-17 DIAGNOSIS — R072 Precordial pain: Secondary | ICD-10-CM | POA: Diagnosis not present

## 2022-05-17 LAB — ECHOCARDIOGRAM COMPLETE
AR max vel: 2.79 cm2
AV Area VTI: 2.94 cm2
AV Area mean vel: 2.58 cm2
AV Mean grad: 4 mmHg
AV Peak grad: 7.2 mmHg
Ao pk vel: 1.34 m/s
Area-P 1/2: 4.39 cm2
Calc EF: 67.4 %
S' Lateral: 2.8 cm
Single Plane A2C EF: 66.2 %
Single Plane A4C EF: 68.7 %

## 2022-05-23 ENCOUNTER — Encounter: Payer: Self-pay | Admitting: Cardiology

## 2022-05-23 ENCOUNTER — Ambulatory Visit (INDEPENDENT_AMBULATORY_CARE_PROVIDER_SITE_OTHER): Payer: 59 | Admitting: Cardiology

## 2022-05-23 VITALS — BP 118/74 | HR 105 | Ht 66.0 in | Wt 224.4 lb

## 2022-05-23 DIAGNOSIS — R0600 Dyspnea, unspecified: Secondary | ICD-10-CM

## 2022-05-23 DIAGNOSIS — R072 Precordial pain: Secondary | ICD-10-CM

## 2022-05-23 DIAGNOSIS — Z6836 Body mass index (BMI) 36.0-36.9, adult: Secondary | ICD-10-CM | POA: Diagnosis not present

## 2022-05-23 NOTE — Progress Notes (Signed)
Cardiology Clinic Note   Patient Name: Sydney Giles Date of Encounter: 05/23/2022  Primary Care Provider:  Ailene Ravel, MD Primary Cardiologist:  Debbe Odea, MD  Patient Profile    46 year old female with no significant past medical history, who is here today to follow up on her complaints of chest pain and shoulder pain and recent outpatient testing.   Past Medical History    Past Medical History:  Diagnosis Date   Abnormal Pap smear    No pertinent past medical history    Ovarian cyst    Past Surgical History:  Procedure Laterality Date   COLPOSCOPY     OVARIAN CYST SURGERY      Allergies  No Known Allergies  History of Present Illness    46 year old female with no significant past medical history.  She has had complaints of  chest pain with radiation into her shoulder down her left arm for greater than 9 months mostly related with stress or when she was upset.  She endorses continued shortness of breath when she exerts herself or goes upstairs.  The episodes resolve quickly with rest.   She did undergo coronary CTA which revealed normal coronary calcium score of 0.  Patient low risk for coronary events.  Normal coronary origin with right dominance.  No evidence of CAD.  She also underwent echocardiogram which revealed LVEF of 60 to 65%.  There were no wall motion abnormalities, right ventricular systolic function was normal, there was note of mild mitral valve regurgitation.  She returns to clinic today stating that she feels fairly well.  She had been notified that her test were abnormal.  Understanding her test results has decreased some of the anxiety as well. She is only noted mild episodes of chest discomfort with extreme stress.  Denies any palpitations, peripheral edema, or worsening dyspnea.  Denies any recent hospitalizations or visits to the emergency department  Home Medications    Current Outpatient Medications  Medication Sig Dispense  Refill   JUNEL FE 1/20 1-20 MG-MCG tablet Take 1 tablet by mouth daily.     No current facility-administered medications for this visit.     Family History    Family History  Problem Relation Age of Onset   Arthritis/Rheumatoid Mother    Diabetes Father    She indicated that her mother is alive. She indicated that her father is alive.  Social History    Social History   Socioeconomic History   Marital status: Married    Spouse name: Not on file   Number of children: Not on file   Years of education: Not on file   Highest education level: Not on file  Occupational History   Not on file  Tobacco Use   Smoking status: Never   Smokeless tobacco: Never  Vaping Use   Vaping Use: Never used  Substance and Sexual Activity   Alcohol use: No   Drug use: No   Sexual activity: Yes  Other Topics Concern   Not on file  Social History Narrative   Not on file   Social Determinants of Health   Financial Resource Strain: Not on file  Food Insecurity: Not on file  Transportation Needs: Not on file  Physical Activity: Not on file  Stress: Not on file  Social Connections: Not on file  Intimate Partner Violence: Not on file     Review of Systems    General:  No chills, fever, night sweats or weight  changes.  Cardiovascular:  Endorses occasional  chest pain, dyspnea on exertion, edema, but denies orthopnea, palpitations, paroxysmal nocturnal dyspnea. Dermatological: No rash, lesions/masses Respiratory: No cough, endorses occasional dyspnea Urologic: No hematuria, dysuria Abdominal:   No nausea, vomiting, diarrhea, bright red blood per rectum, melena, or hematemesis Neurologic:  No visual changes, wkns, changes in mental status. All other systems reviewed and are otherwise negative except as noted above.     Physical Exam    VS:  BP 118/74 (BP Location: Left Arm, Patient Position: Sitting, Cuff Size: Large)   Pulse (!) 105   Ht 5\' 6"  (1.676 m)   Wt 224 lb 6.4 oz (101.8  kg)   SpO2 99%   BMI 36.22 kg/m  , BMI Body mass index is 36.22 kg/m.     GEN: Well nourished, well developed, in no acute distress. HEENT: normal. Neck: Supple, no JVD, carotid bruits, or masses. Cardiac: RRR, no murmurs, rubs, or gallops. No clubbing, cyanosis, edema.  Radials/DP/PT 2+ and equal bilaterally.  Respiratory:  Respirations regular and unlabored, clear to auscultation bilaterally. GI: Soft, nontender, nondistended, BS + x 4. MS: no deformity or atrophy. Skin: warm and dry, no rash. Neuro:  Strength and sensation are intact. Psych: Normal affect.  Accessory Clinical Findings    ECG personally reviewed by me today- No new tracings were completed today.  Lab Results  Component Value Date   WBC 6.3 07/28/2016   HGB 12.4 07/28/2016   HCT 37.5 07/28/2016   MCV 94.1 07/28/2016   PLT 194 07/28/2016   Lab Results  Component Value Date   CREATININE 0.69 05/09/2022   BUN 12 05/09/2022   NA 137 05/09/2022   K 4.0 05/09/2022   CL 110 05/09/2022   CO2 20 (L) 05/09/2022   Lab Results  Component Value Date   ALT 14 07/26/2016   AST 18 07/26/2016   ALKPHOS 43 07/26/2016   BILITOT 0.9 07/26/2016   Lab Results  Component Value Date   CHOL 184 04/18/2007   HDL 64 04/18/2007   LDLCALC 104 04/18/2007   TRIG 80 04/18/2007   CHOLHDL 2.9 04/18/2007    No results found for: "HGBA1C"  Assessment & Plan   1.  Atypical chest pain that has slightly improved. Seems to be related to stress and anxiety. Calcium score of 0. No regional wall motion abnormalities noted on echo with LVEF  60-65%. Discussed talking with PCP about starting low dose anti-anxiety medications.  2. Shortness of breath/ dyspnea on exertion with reassuring results of echocardiogram and Coronary CT. Recommend increasing activity. Likely a component of deconditioning.  3. Obesity BMI 36.24, recommend increasing activity, low calorie diet, and weight loss. Continue to work on life style modifications.    4. Disposition patient to return to clinic to see MD/APP in 6 months   Boluwatife Flight, NP 05/23/2022, 4:07 PM

## 2022-05-23 NOTE — Patient Instructions (Signed)
Medication Instructions:  Your physician recommends that you continue on your current medications as directed. Please refer to the Current Medication list given to you today.  *If you need a refill on your cardiac medications before your next appointment, please call your pharmacy*  Follow-Up: At [facility], you and your health needs are our priority.  As part of our continuing mission to provide you with exceptional heart care, we have created designated Provider Care Teams.  These Care Teams include your primary Cardiologist (physician) and Advanced Practice Providers (APPs -  Physician Assistants and Nurse Practitioners) who all work together to provide you with the care you need, when you need it.  Your next appointment:   6 month(s)  The format for your next appointment:   In Person  Provider:   You may see Debbe Odea, MD or one of the following Advanced Practice Providers on your designated Care Team:   Nicolasa Ducking, NP Eula Listen, PA-C Cadence Fransico Michael, PA-C Charlsie Quest, NP  Important Information About Sugar

## 2022-10-04 ENCOUNTER — Emergency Department: Payer: 59

## 2022-10-04 ENCOUNTER — Other Ambulatory Visit: Payer: Self-pay

## 2022-10-04 ENCOUNTER — Emergency Department
Admission: EM | Admit: 2022-10-04 | Discharge: 2022-10-04 | Discharge status: Against Medical Advice | Payer: 59 | Attending: Emergency Medicine | Admitting: Emergency Medicine

## 2022-10-04 ENCOUNTER — Encounter: Payer: Self-pay | Admitting: Emergency Medicine

## 2022-10-04 DIAGNOSIS — R55 Syncope and collapse: Secondary | ICD-10-CM | POA: Diagnosis not present

## 2022-10-04 DIAGNOSIS — R42 Dizziness and giddiness: Secondary | ICD-10-CM | POA: Insufficient documentation

## 2022-10-04 DIAGNOSIS — Z1152 Encounter for screening for COVID-19: Secondary | ICD-10-CM | POA: Insufficient documentation

## 2022-10-04 DIAGNOSIS — Z5321 Procedure and treatment not carried out due to patient leaving prior to being seen by health care provider: Secondary | ICD-10-CM | POA: Diagnosis not present

## 2022-10-04 LAB — COMPREHENSIVE METABOLIC PANEL
ALT: 15 U/L (ref 0–44)
AST: 20 U/L (ref 15–41)
Albumin: 3.8 g/dL (ref 3.5–5.0)
Alkaline Phosphatase: 42 U/L (ref 38–126)
Anion gap: 9 (ref 5–15)
BUN: 8 mg/dL (ref 6–20)
CO2: 22 mmol/L (ref 22–32)
Calcium: 8.7 mg/dL — ABNORMAL LOW (ref 8.9–10.3)
Chloride: 107 mmol/L (ref 98–111)
Creatinine, Ser: 0.61 mg/dL (ref 0.44–1.00)
GFR, Estimated: >60 mL/min (ref >60)
Glucose, Bld: 97 mg/dL (ref 70–99)
Potassium: 3.7 mmol/L (ref 3.5–5.1)
Sodium: 138 mmol/L (ref 135–145)
Total Bilirubin: 0.4 mg/dL (ref 0.3–1.2)
Total Protein: 6.6 g/dL (ref 6.5–8.1)

## 2022-10-04 LAB — CBC WITH DIFFERENTIAL/PLATELET
Abs Immature Granulocytes: 0.05 10*3/uL (ref 0.00–0.07)
Basophils Absolute: 0 10*3/uL (ref 0.0–0.1)
Basophils Relative: 1 %
Eosinophils Absolute: 0.2 10*3/uL (ref 0.0–0.5)
Eosinophils Relative: 2 %
HCT: 42.8 % (ref 36.0–46.0)
Hemoglobin: 14 g/dL (ref 12.0–15.0)
Immature Granulocytes: 1 %
Lymphocytes Relative: 26 %
Lymphs Abs: 2.2 10*3/uL (ref 0.7–4.0)
MCH: 30.8 pg (ref 26.0–34.0)
MCHC: 32.7 g/dL (ref 30.0–36.0)
MCV: 94.1 fL (ref 80.0–100.0)
Monocytes Absolute: 0.7 10*3/uL (ref 0.1–1.0)
Monocytes Relative: 8 %
Neutro Abs: 5.5 10*3/uL (ref 1.7–7.7)
Neutrophils Relative %: 62 %
Platelets: 285 10*3/uL (ref 150–400)
RBC: 4.55 MIL/uL (ref 3.87–5.11)
RDW: 13 % (ref 11.5–15.5)
WBC: 8.6 10*3/uL (ref 4.0–10.5)
nRBC: 0 % (ref 0.0–0.2)

## 2022-10-04 LAB — RESP PANEL BY RT-PCR (RSV, FLU A&B, COVID)  RVPGX2
Influenza A by PCR: NEGATIVE
Influenza B by PCR: NEGATIVE
Resp Syncytial Virus by PCR: NEGATIVE
SARS Coronavirus 2 by RT PCR: NEGATIVE

## 2022-10-04 LAB — POC URINE PREG, ED: Preg Test, Ur: NEGATIVE

## 2022-10-04 LAB — URINALYSIS, ROUTINE W REFLEX MICROSCOPIC
Bilirubin Urine: NEGATIVE
Glucose, UA: NEGATIVE mg/dL
Hgb urine dipstick: NEGATIVE
Ketones, ur: NEGATIVE mg/dL
Leukocytes,Ua: NEGATIVE
Nitrite: NEGATIVE
Protein, ur: NEGATIVE mg/dL
Specific Gravity, Urine: 1.028 (ref 1.005–1.030)
pH: 5 (ref 5.0–8.0)

## 2022-10-04 LAB — LIPASE, BLOOD: Lipase: 32 U/L (ref 11–51)

## 2022-10-04 LAB — TROPONIN I (HIGH SENSITIVITY)
Troponin I (High Sensitivity): 2 ng/L (ref <18)
Troponin I (High Sensitivity): 3 ng/L (ref <18)

## 2022-10-04 NOTE — ED Provider Triage Note (Signed)
Emergency Medicine Provider Triage Evaluation Note  Sydney Giles, a 47 y.o. female  was evaluated in triage.  Pt complains of intermittent dizziness for the last week. She had an episode of near-syncope today with positional change. Her husband used his pulse ox to confirm a HR of 54 bpm at that time. She would endorse NVD last week. She presents via POV from home.   Review of Systems  Positive: Dizziness, near-syncope Negative: FCS  Physical Exam  BP (!) 150/90 (BP Location: Right Arm)   Pulse 84   Temp 98.8 F (37.1 C) (Oral)   Resp 16   Ht 5\' 6"  (1.676 m)   Wt 99.8 kg   LMP 09/26/2022 (Approximate)   SpO2 100%   BMI 35.51 kg/m  Gen:   Awake, no distress  NAD Resp:  Normal effort  MSK:   Moves extremities without difficulty  Other:    Medical Decision Making  Medically screening exam initiated at 9:28 PM.  Appropriate orders placed.  Sydney Giles was informed that the remainder of the evaluation will be completed by another provider, this initial triage assessment does not replace that evaluation, and the importance of remaining in the ED until their evaluation is complete.  Patient to the ED for evaluation of intermittent dizziness, recent NVD, and near-syncope today.    Melvenia Needles, PA-C 10/04/22 2130

## 2022-10-04 NOTE — ED Notes (Signed)
Per pts husband pts HR was 54 at home.

## 2022-10-04 NOTE — ED Triage Notes (Signed)
Pt to ED from home c/o dizziness x1 week, near syncope today.  States works at home and got up from her chair when she became dizzy.  Reports pain to left arm for over a year.  Husband checked vitals at home and BP was 54 so became concerned and came to ED.  States diarrhea intermittently over last week.  Pt A&Ox4, skin WNL, chest rise even and unlabored, and in NAD at this time.  Cannon Kettle, PA in triage for MSE.

## 2023-06-12 ENCOUNTER — Other Ambulatory Visit: Payer: Self-pay | Admitting: Obstetrics and Gynecology

## 2023-06-12 DIAGNOSIS — R928 Other abnormal and inconclusive findings on diagnostic imaging of breast: Secondary | ICD-10-CM

## 2023-06-19 ENCOUNTER — Other Ambulatory Visit: Payer: Self-pay | Admitting: Obstetrics and Gynecology

## 2023-06-19 ENCOUNTER — Ambulatory Visit
Admission: RE | Admit: 2023-06-19 | Discharge: 2023-06-19 | Disposition: A | Discharge status: Home / Self Care | Payer: 59 | Source: Ambulatory Visit | Attending: Obstetrics and Gynecology | Admitting: Obstetrics and Gynecology

## 2023-06-19 DIAGNOSIS — R928 Other abnormal and inconclusive findings on diagnostic imaging of breast: Secondary | ICD-10-CM

## 2023-06-19 DIAGNOSIS — R921 Mammographic calcification found on diagnostic imaging of breast: Secondary | ICD-10-CM

## 2023-06-26 ENCOUNTER — Ambulatory Visit
Admission: RE | Admit: 2023-06-26 | Discharge: 2023-06-26 | Disposition: A | Discharge status: Home / Self Care | Payer: 59 | Source: Ambulatory Visit | Attending: Obstetrics and Gynecology | Admitting: Obstetrics and Gynecology

## 2023-06-26 DIAGNOSIS — R921 Mammographic calcification found on diagnostic imaging of breast: Secondary | ICD-10-CM

## 2023-06-26 DIAGNOSIS — R928 Other abnormal and inconclusive findings on diagnostic imaging of breast: Secondary | ICD-10-CM

## 2023-06-26 HISTORY — PX: BREAST BIOPSY: SHX20

## 2023-06-27 LAB — SURGICAL PATHOLOGY

## 2023-07-04 DIAGNOSIS — C50919 Malignant neoplasm of unspecified site of unspecified female breast: Secondary | ICD-10-CM

## 2023-07-04 HISTORY — DX: Malignant neoplasm of unspecified site of unspecified female breast: C50.919

## 2023-07-07 ENCOUNTER — Other Ambulatory Visit: Payer: Self-pay | Admitting: Surgery

## 2023-07-07 DIAGNOSIS — D0511 Intraductal carcinoma in situ of right breast: Secondary | ICD-10-CM

## 2023-07-11 ENCOUNTER — Other Ambulatory Visit: Payer: Self-pay | Admitting: Surgery

## 2023-07-11 DIAGNOSIS — D0511 Intraductal carcinoma in situ of right breast: Secondary | ICD-10-CM

## 2023-07-11 NOTE — Progress Notes (Signed)
New Breast Cancer Diagnosis: Right Breast UIQ  Did patient present with symptoms (if so, please note symptoms) or screening mammography?:Screening Calcifications    Location and Extent of disease :right breast. Located in the upper inner quadrant, measured 0.7 cm in greatest dimension. Adenopathy no.   Histology per Pathology Report: grade 2, DCIS  Receptor Status: ER(positive), PR (positive), Her2-neu (), Ki-(%)   Surgeon and surgical plan, if any:  Dr. Magnus Ivan 07/07/2023 -We next discussed proceeding with a radioactive seed guided right breast lumpectomy.  -She will be referred to the cancer center to see medical and radiation oncology as well for their opinions.  -Radioactive seed guided right breast lumpectomy 07/27/2023   Medical oncologist, treatment if any:   Vincent Gros NP / Dr. Mosetta Putt 07/24/2023   Family History of Breast/Ovarian/Prostate Cancer: Paternal Aunt had breast cancer  Lymphedema issues, if any: Denies.     Pain issues, if any: Occasional shooting pain in the breast.    SAFETY ISSUES: Prior radiation? No Pacemaker/ICD? No Possible current pregnancy? Having Cycles, On birth control pills Is the patient on methotrexate? No   Current Complaints / other details:

## 2023-07-12 ENCOUNTER — Ambulatory Visit
Admission: RE | Admit: 2023-07-12 | Discharge: 2023-07-12 | Disposition: A | Discharge status: Home / Self Care | Payer: 59 | Source: Ambulatory Visit | Attending: Radiation Oncology | Admitting: Radiation Oncology

## 2023-07-12 ENCOUNTER — Encounter: Payer: Self-pay | Admitting: Radiation Oncology

## 2023-07-12 VITALS — Ht 66.0 in | Wt 220.0 lb

## 2023-07-12 DIAGNOSIS — D0511 Intraductal carcinoma in situ of right breast: Secondary | ICD-10-CM | POA: Insufficient documentation

## 2023-07-12 NOTE — Progress Notes (Signed)
Radiation Oncology         [phone] ________________________________  Initial Outpatient Consultation - Conducted via telephone at patient request.  I spoke with the patient to conduct this consult visit via telephone. The patient was notified in advance and was offered an in person or telemedicine meeting to allow for face to face communication but instead preferred to proceed with a telephone consult.    Name: Sydney Giles        MRN: [id number]  Date of Service: 07/12/2023 DOB: 1976/08/14  NW:[id number], Durward Fortes, MD  Abigail Miyamoto, MD     REFERRING PHYSICIAN: Abigail Miyamoto, MD   DIAGNOSIS: The encounter diagnosis was Ductal carcinoma in situ (DCIS) of right breast.   HISTORY OF PRESENT ILLNESS: Sydney Giles is a 47 y.o. female seen at the request of Dr. Magnus Ivan for a newly diagnosed right breast cancer.  The patient presented with screening mammogram which found calcifications in her right breast and further diagnostic imaging on 06/19/2023 showed an indeterminate group of calcifications in the upper inner quadrant of the right measuring up to 1.2 cm in greatest dimension.  She underwent a stereotactic biopsy on 06/26/2023 that showed an intermediate grade DCIS that had calcifications and necrosis that was ER/PR positive.  She has met with Dr. Magnus Ivan and has plans to undergo right lumpectomy on 07/27/2023 and is contacted today by phone to review the role of adjuvant radiation.  Of note she will also be meeting with Dr. Mosetta Putt and medical oncology on 07/24/23.     PREVIOUS RADIATION THERAPY: No   PAST MEDICAL HISTORY:  Past Medical History:  Diagnosis Date   Abnormal Pap smear    Breast cancer (HCC) 07/2023   No pertinent past medical history    Ovarian cyst        PAST SURGICAL HISTORY: Past Surgical History:  Procedure Laterality Date   BREAST BIOPSY Right 06/26/2023   MM RT BREAST BX W LOC DEV 1ST LESION IMAGE BX SPEC STEREO GUIDE 06/26/2023 [facility]  MAMMOGRAPHY   COLPOSCOPY     OVARIAN CYST SURGERY       FAMILY HISTORY:  Family History  Problem Relation Age of Onset   Arthritis/Rheumatoid Mother    Diabetes Father    Breast cancer Paternal Aunt      SOCIAL HISTORY:  reports that she has never smoked. She has never used smokeless tobacco. She reports that she does not drink alcohol and does not use drugs. The patient is married and lives in [place]. 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 She has a son who is 86 and in 5th grade.    ALLERGIES: Patient has no known allergies.   MEDICATIONS:  Current Outpatient Medications  Medication Sig Dispense Refill   Cholecalciferol (VITAMIN D) 125 MCG (5000 UT) CAPS      escitalopram (LEXAPRO) 20 MG tablet Take 1 tablet by mouth daily.     JUNEL FE 1/20 1-20 MG-MCG tablet Take 1 tablet by mouth daily.     No current facility-administered medications for this encounter.     REVIEW OF SYSTEMS: On review of systems, the patient reports that she is doing well but acknowledges the amount the information she's trying to navigate is a bit overwhelming, while also being a busy with work, and being a mom and spouse.      PHYSICAL EXAM:  Wt Readings from Last 3 Encounters:  07/12/23 220 lb (99.8 kg)  10/04/22  220 lb (99.8 kg)  05/23/22 224 lb 6.4 oz (101.8 kg)   Otherwise unable to assess due to encounter type    ECOG = 0  0 - Asymptomatic (Fully active, able to carry on all predisease activities without restriction)  1 - Symptomatic but completely ambulatory (Restricted in physically strenuous activity but ambulatory and able to carry out work of a light or sedentary nature. For example, light housework, office work)  2 - Symptomatic, <50% in bed during the day (Ambulatory and capable of all self care but unable to carry out any work activities. Up and about more than 50% of waking hours)  3 - Symptomatic, >50% in bed, but not bedbound (Capable  of only limited self-care, confined to bed or chair 50% or more of waking hours)  4 - Bedbound (Completely disabled. Cannot carry on any self-care. Totally confined to bed or chair)  5 - Death   Santiago Glad MM, Creech RH, Tormey DC, et al. [phone]). "Toxicity and response criteria of the [facility]". Am. Evlyn Clines. Oncol. 5 (6): 649-55    LABORATORY DATA:  Lab Results  Component Value Date   WBC 8.6 10/04/2022   HGB 14.0 10/04/2022   HCT 42.8 10/04/2022   MCV 94.1 10/04/2022   PLT 285 10/04/2022   Lab Results  Component Value Date   NA 138 10/04/2022   K 3.7 10/04/2022   CL 107 10/04/2022   CO2 22 10/04/2022   Lab Results  Component Value Date   ALT 15 10/04/2022   AST 20 10/04/2022   ALKPHOS 42 10/04/2022   BILITOT 0.4 10/04/2022      RADIOGRAPHY: MM RT BREAST BX W LOC DEV 1ST LESION IMAGE BX SPEC STEREO GUIDE  Addendum Date: 06/27/2023   ADDENDUM REPORT: 06/27/2023 13:44 ADDENDUM: PATHOLOGY revealed: Site Breast, RIGHT, needle core biopsy, upper inner, ribbon clip DUCTAL CARCINOMA IN SITU, CRIBRIFORM, INTERMEDIATE NUCLEAR GRADE NECROSIS: PRESENT CALCIFICATIONS: PRESENT DCIS LENGTH: 0.7 CM Pathology results are CONCORDANT with imaging findings, per Dr. Emmaline Kluver. Pathology results and recommendations below were discussed with patient by telephone on 06/27/2023. Patient reported biopsy site with slight tenderness at the site. Post biopsy care instructions were reviewed, questions were answered and my direct phone number was provided to patient. Patient was instructed to call the [facility] if any concerns or questions arise related to the biopsy. RECOMMENDATION: Surgical consultation has been arranged for patient to see Dr. Magnus Ivan at [facility] on 07/03/2023. Pathology results reported by Lynett Grimes, RN on 06/27/2023. Electronically Signed   By: Emmaline Kluver M.D.   On: 06/27/2023 13:44   Result Date:  06/27/2023 CLINICAL DATA:  47 year old female presenting for biopsy of calcifications in the right breast. EXAM: RIGHT BREAST STEREOTACTIC CORE NEEDLE BIOPSY COMPARISON:  Previous exam(s). FINDINGS: The patient and I discussed the procedure of stereotactic-guided biopsy including benefits and alternatives. We discussed the high likelihood of a successful procedure. We discussed the risks of the procedure including infection, bleeding, tissue injury, clip migration, and inadequate sampling. Informed written consent was given. The usual time out protocol was performed immediately prior to the procedure. Using sterile technique and 1% Lidocaine as local anesthetic, under stereotactic guidance, a 9 gauge vacuum assisted device was used to perform core needle biopsy of calcifications in the upper inner right breast using a superior approach. Specimen radiograph was performed showing at least 2 specimens with calcifications. Specimens with calcifications are identified for pathology. Lesion quadrant: Upper inner quadrant  At the conclusion of the procedure, a ribbon shaped tissue marker clip was deployed into the biopsy cavity. Follow-up 2-view mammogram was performed and dictated separately. IMPRESSION: Stereotactic-guided biopsy of calcifications in the upper inner right breast. No apparent complications. Electronically Signed: By: Emmaline Kluver M.D. On: 06/26/2023 11:56   MM CLIP PLACEMENT RIGHT  Result Date: 06/26/2023 CLINICAL DATA:  Post procedure mammogram for clip placement EXAM: 3D DIAGNOSTIC RIGHT MAMMOGRAM POST STEREOTACTIC BIOPSY COMPARISON:  Previous exam(s). FINDINGS: 3D Mammographic images were obtained following stereotactic guided biopsy of calcifications in the upper inner right breast. The ribbon biopsy marking clip is in expected position at the site of biopsy. IMPRESSION: Appropriate positioning of the ribbon shaped biopsy marking clip at the site of biopsy in the upper inner right breast.  Please note the post biopsy films were performed during and IT down time and the ML view is blurry. The clip is likely within the appropriate position. If localization is necessary recommend repeating the ML view prior to the procedure to ensure appropriate clip placement. Final Assessment: Post Procedure Mammograms for Marker Placement BI-RADS CATEGORY  Post-Procedure Mammogram for Marker Placement Electronically Signed   By: Emmaline Kluver M.D.   On: 06/26/2023 11:55   MM Digital Diagnostic Unilat R  Result Date: 06/19/2023 CLINICAL DATA:  47 year old female presenting as a recall from screening for possible right breast calcifications. EXAM: DIGITAL DIAGNOSTIC UNILATERAL RIGHT MAMMOGRAM TECHNIQUE: Right digital diagnostic mammography was performed. COMPARISON:  Previous exam(s). ACR Breast Density Category b: There are scattered areas of fibroglandular density. FINDINGS: Spot 2D magnification views and full true lateral tomosynthesis views of the right breast were performed demonstrating persistence of a small group of pleomorphic calcifications in a linear distribution measuring 1.2 cm in the upper inner right breast. No associated mass or distortion. IMPRESSION: Indeterminate calcifications measuring 1.2 cm in the upper inner right breast. RECOMMENDATION: Stereotactic core needle biopsy x1 of the right breast I have discussed the findings and recommendations with the patient. If applicable, a reminder letter will be sent to the patient regarding the next appointment. BI-RADS CATEGORY  4: Suspicious. Electronically Signed   By: Emmaline Kluver M.D.   On: 06/19/2023 09:25       IMPRESSION/PLAN: 1. 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 Dr. Mitzi Hansen discusses the pathology findings and reviews the nature of noninvasive right breast disease.  The patient is already scheduled to undergo breast conservation with lumpectomy and will be meeting with Dr. Mosetta Putt in a few weeks and we  anticipate that she will offer the patient adjuvant antiestrogen therapy based on prognostic panel.  Dr. Mitzi Hansen recommends external radiotherapy to the breast  to reduce risks of local recurrence. We discussed the risks, benefits, short, and long term effects of radiotherapy, as well as the curative intent, and the patient is interested in proceeding. Dr. Mitzi Hansen discusses the delivery and logistics of radiotherapy and anticipates a course of 4 weeks of radiotherapy to the right breast. We will see her back a few weeks after surgery to discuss the simulation process and anticipate we starting radiotherapy about 4-6 weeks after surgery.  2. Possible genetic predisposition to malignancy. The patient is a candidate for genetic testing given her personal history and will consider the option of testing.  3. Contraceptive Counseling. The patient is planning to finish this week of her pill pack and have her menstrual cycle next week. She will not resume this as she will be meeting with Dr. Mosetta Putt to discuss  antiestrogen therapy as well in the adjuvant setting. We discussed the need to avoid pregnancy during radiation and she will likely need a home pregnancy test prior to proceeding with radiation if she is active between surgery and simulation.   The patient has provided two factor identification and has given verbal consent for this type of encounter and has been advised to only accept a meeting of this type in a secure network environment. The time spent during this encounter was 60 minutes including preparation, discussion, and coordination of the patient's care. The attendants for this meeting include Rober Minion, RN, Dr. Mitzi Hansen, Ronny Bacon  and Everardo Beals.  During the encounter,  Rober Minion, RN, Dr. Mitzi Hansen, and Ronny Bacon were located at [facility] Radiation Oncology Department.  Sydney Giles was located at home.   The above documentation reflects my direct  findings during this shared patient visit. Please see the separate note by Dr. Mitzi Hansen on this date for the remainder of the patient's plan of care.    Osker Mason, [facility]    **Disclaimer: This note was dictated with voice recognition software. Similar sounding words can inadvertently be transcribed and this note may contain transcription errors which may not have been corrected upon publication of note.**

## 2023-07-19 NOTE — Pre-Procedure Instructions (Signed)
Surgical Instructions   Your procedure is scheduled on July 27, 2023. Report to [facility] Main Entrance "A" at 8:00 A.M., then check in with the Admitting office. Any questions or running late day of surgery: call [phone]  Questions prior to your surgery date: call [phone], Monday-Friday, 8am-4pm. If you experience any cold or flu symptoms such as cough, fever, chills, shortness of breath, etc. between now and your scheduled surgery, please notify us at the above number.     Remember:  Do not eat after midnight the night before your surgery   You may drink clear liquids until 7:00 AM the morning of your surgery.   Clear liquids allowed are: Water, Non-Citrus Juices (without pulp), Carbonated Beverages, Clear Tea, Black Coffee Only (NO MILK, CREAM OR POWDERED CREAMER of any kind), and Gatorade.  Patient Instructions  The night before surgery:  No food after midnight. ONLY clear liquids after midnight  The day of surgery (if you do NOT have diabetes):  Drink ONE (1) Pre-Surgery Clear Ensure by 7:00 AM the morning of surgery. Drink in one sitting. Do not sip.  This drink was given to you during your hospital  pre-op appointment visit.  Nothing else to drink after completing the  Pre-Surgery Clear Ensure.         If you have questions, please contact your surgeon's office.     Take these medicines the morning of surgery with A SIP OF WATER: escitalopram (LEXAPRO)  Norethindrone Acetate-Ethinyl Estrad-FE (BLISOVI 24 FE)    May take these medicines IF NEEDED: fluticasone (FLONASE) nasal spray    One week prior to surgery, STOP taking any Aspirin (unless otherwise instructed by your surgeon) Aleve, Naproxen, Ibuprofen, Motrin, Advil, Goody's, BC's, all herbal medications, fish oil, and non-prescription vitamins.                     Do NOT Smoke (Tobacco/Vaping) for 24 hours prior to your procedure.  If you use a CPAP at night, you may bring your mask/headgear  for your overnight stay.   You will be asked to remove any contacts, glasses, piercing's, hearing aid's, dentures/partials prior to surgery. Please bring cases for these items if needed.    Patients discharged the day of surgery will not be allowed to drive home, and someone needs to stay with them for 24 hours.  SURGICAL WAITING ROOM VISITATION Patients may have no more than 2 support people in the waiting area - these visitors may rotate.   Pre-op nurse will coordinate an appropriate time for 1 ADULT support person, who may not rotate, to accompany patient in pre-op.  Children under the age of 109 must have an adult with them who is not the patient and must remain in the main waiting area with an adult.  If the patient needs to stay at the hospital during part of their recovery, the visitor guidelines for inpatient rooms apply.  Please refer to the [facility] website for the visitor guidelines for any additional information.   If you received a COVID test during your pre-op visit  it is requested that you wear a mask when out in public, stay away from anyone that may not be feeling well and notify your surgeon if you develop symptoms. If you have been in contact with anyone that has tested positive in the last 10 days please notify you surgeon.      Pre-operative CHG Bathing Instructions   You can play a key role in  reducing the risk of infection after surgery. Your skin needs to be as free of germs as possible. You can reduce the number of germs on your skin by washing with CHG (chlorhexidine gluconate) soap before surgery. CHG is an antiseptic soap that kills germs and continues to kill germs even after washing.   DO NOT use if you have an allergy to chlorhexidine/CHG or antibacterial soaps. If your skin becomes reddened or irritated, stop using the CHG and notify one of our RNs at [phone].              TAKE A SHOWER THE NIGHT BEFORE SURGERY AND THE DAY OF SURGERY    Please keep  in mind the following:  DO NOT shave, including legs and underarms, 48 hours prior to surgery.   You may shave your face before/day of surgery.  Place clean sheets on your bed the night before surgery Use a clean washcloth (not used since being washed) for each shower. DO NOT sleep with pet's night before surgery.  CHG Shower Instructions:  Wash your face and private area with normal soap. If you choose to wash your hair, wash first with your normal shampoo.  After you use shampoo/soap, rinse your hair and body thoroughly to remove shampoo/soap residue.  Turn the water OFF and apply half the bottle of CHG soap to a CLEAN washcloth.  Apply CHG soap ONLY FROM YOUR NECK DOWN TO YOUR TOES (washing for 3-5 minutes)  DO NOT use CHG soap on face, private areas, open wounds, or sores.  Pay special attention to the area where your surgery is being performed.  If you are having back surgery, having someone wash your back for you may be helpful. Wait 2 minutes after CHG soap is applied, then you may rinse off the CHG soap.  Pat dry with a clean towel  Put on clean pajamas    Additional instructions for the day of surgery: DO NOT APPLY any lotions, deodorants, cologne, or perfumes.   Do not wear jewelry or makeup Do not wear nail polish, gel polish, artificial nails, or any other type of covering on natural nails (fingers and toes) Do not bring valuables to the hospital. [facility] is not responsible for valuables/personal belongings. Put on clean/comfortable clothes.  Please brush your teeth.  Ask your nurse before applying any prescription medications to the skin.

## 2023-07-20 ENCOUNTER — Encounter (HOSPITAL_COMMUNITY)
Admission: RE | Admit: 2023-07-20 | Discharge: 2023-07-20 | Disposition: A | Discharge status: Home / Self Care | Payer: 59 | Source: Ambulatory Visit | Attending: Surgery | Admitting: Surgery

## 2023-07-20 ENCOUNTER — Encounter (HOSPITAL_COMMUNITY): Payer: Self-pay

## 2023-07-20 ENCOUNTER — Other Ambulatory Visit: Payer: Self-pay

## 2023-07-20 VITALS — BP 131/81 | HR 87 | Temp 98.5°F | Resp 18 | Ht 66.0 in | Wt 222.3 lb

## 2023-07-20 DIAGNOSIS — I10 Essential (primary) hypertension: Secondary | ICD-10-CM | POA: Diagnosis not present

## 2023-07-20 DIAGNOSIS — Z01812 Encounter for preprocedural laboratory examination: Secondary | ICD-10-CM | POA: Diagnosis present

## 2023-07-20 DIAGNOSIS — Z01818 Encounter for other preprocedural examination: Secondary | ICD-10-CM

## 2023-07-20 HISTORY — DX: Anxiety disorder, unspecified: F41.9

## 2023-07-20 LAB — BASIC METABOLIC PANEL
Anion gap: 11 (ref 5–15)
BUN: 12 mg/dL (ref 6–20)
CO2: 22 mmol/L (ref 22–32)
Calcium: 9.2 mg/dL (ref 8.9–10.3)
Chloride: 105 mmol/L (ref 98–111)
Creatinine, Ser: 0.74 mg/dL (ref 0.44–1.00)
GFR, Estimated: >60 mL/min (ref >60)
Glucose, Bld: 93 mg/dL (ref 70–99)
Potassium: 4.1 mmol/L (ref 3.5–5.1)
Sodium: 138 mmol/L (ref 135–145)

## 2023-07-20 LAB — CBC
HCT: 45.3 % (ref 36.0–46.0)
Hemoglobin: 14.6 g/dL (ref 12.0–15.0)
MCH: 30.8 pg (ref 26.0–34.0)
MCHC: 32.2 g/dL (ref 30.0–36.0)
MCV: 95.6 fL (ref 80.0–100.0)
Platelets: 306 10*3/uL (ref 150–400)
RBC: 4.74 MIL/uL (ref 3.87–5.11)
RDW: 12.8 % (ref 11.5–15.5)
WBC: 6.2 10*3/uL (ref 4.0–10.5)
nRBC: 0 % (ref 0.0–0.2)

## 2023-07-20 NOTE — Progress Notes (Signed)
PCP - Archie Endo, FNP Cardiologist - denies (pt did see Dr. Debbe Odea 05/23/22 due to left arm pain. She said everything checked out cardiac wise, and she was put on anxiety medication. It's noted to f/u in 6 months. Pt did not f/u and said she has not had any more issues.)  PPM/ICD - denies   Chest x-ray - 10/04/22 EKG - 10/04/22 Stress Test - denies ECHO - 05/17/22 Cardiac Cath - denies  Sleep Study - denies    DM- denies  ASA/Blood Thinner Instructions: n/a   ERAS Protcol - yes PRE-SURGERY Ensure given at PAT  COVID TEST- n/a   Anesthesia review: yes, breast seed placement. Pt has an appt to come to PAT on 10/23 prior to seed placement for urine pregnancy test  Patient denies shortness of breath, fever, cough and chest pain at PAT appointment   All instructions explained to the patient, with a verbal understanding of the material. Patient agrees to go over the instructions while at home for a better understanding. The opportunity to ask questions was provided.

## 2023-07-21 NOTE — Progress Notes (Unsigned)
[facility] CONSULT NOTE  Patient Care Team: Hamrick, Durward Fortes, MD as PCP - General (Family Medicine) Debbe Odea, MD as PCP - Cardiology (Cardiology)  Assessment & Plan   No problem-specific Assessment & Plan notes found for this encounter.   I discussed the assessment and treatment plan with the patient.  The patient was provided an opportunity to ask questions and all were answered.  The patient agreed with the plan and demonstrated an understanding of the instructions.  The patient was advised to call back if the symptoms worsen or if the condition fails to improve as anticipated.  Thank you for the opportunity    I provided *** minutes (4:32 PM - 4:32 PM) of face-to-face time during this this encounter and > 50% was spent counseling as documented under my assessment and plan.    Carlean Jews, NP [facility] 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  HISTORY OF PRESENTING ILLNESS:  Sydney Giles is a 47 y.o. female who is being seen today for evaluation of new breast cancer. She was found to have abnormal calcifications in screening mammography of her right breast recently. She underwent a biopsy of the right breast in the upper inner quadrant. This showed intermediate grade ductal carcinoma in situ which was 95% ER +100% PR positive. The total area measured 1.2 cm. She has had no previous problems regarding her breast. She denies nipple discharge. There is no family history of breast cancer. She is otherwise healthy and has no cardiopulmonary issues   The cancer was detected by screening mammogram. The cancer was not palpable prior to diagnosis  I reviewed her records extensively and collaborated the history with  the patient.  SUMMARY OF ONCOLOGIC HISTORY: Oncology History   No history exists.    In terms of breast cancer risk profile:  She menarched at early age of *** and went to menopause at age ***  She had *** pregnancy, her first child was born at age ***  She did ***not breast-fed her child for approximately *** months.  She *** received birth control pills for approximately ***.  She was ***never exposed to fertility medications or hormone replacement therapy.  She has *** family history of Breast/GYN/GI cancer  INTERVAL HISTORY: Currently, she is scheduled to have surgery on 07/27/2023. She is to have a radioactive seed guided lumpectomy. She has already met with radiation oncology prior to today's consultation. It has been recommended for her to have adjuvant radiation starting 4 to 6 weeks after surgery.   MEDICAL HISTORY:  Past Medical History:  Diagnosis Date   Abnormal Pap smear    Anxiety    Breast cancer (HCC) 07/2023   Ovarian cyst     SURGICAL HISTORY: Past Surgical History:  Procedure Laterality Date   BREAST BIOPSY Right 06/26/2023   MM RT BREAST BX W LOC DEV 1ST LESION IMAGE BX SPEC STEREO GUIDE 06/26/2023 [facility]   COLPOSCOPY     OVARIAN CYST SURGERY      SOCIAL HISTORY: Social History   Socioeconomic History   Marital status: Married    Spouse name: Not on file   Number of children: 1   Years of education: Not on file   Highest education level: Not  on file  Occupational History   Not on file  Tobacco Use   Smoking status: Never   Smokeless tobacco: Never  Vaping Use   Vaping status: Never Used  Substance and Sexual Activity   Alcohol use: No   Drug use: No   Sexual activity: Yes    Birth control/protection: Pill  Other Topics Concern   Not on file  Social History Narrative   Not on file   Social Determinants of Health   Financial Resource Strain: Not on file  Food Insecurity: No Food Insecurity (07/12/2023)   Hunger Vital Sign     Worried About Running Out of Food in the Last Year: Never true    Ran Out of Food in the Last Year: Never true  Transportation Needs: No Transportation Needs (07/12/2023)   PRAPARE - Administrator, Civil Service (Medical): No    Lack of Transportation (Non-Medical): No  Physical Activity: Not on file  Stress: Not on file  Social Connections: Not on file  Intimate Partner Violence: Not At Risk (07/12/2023)   Humiliation, Afraid, Rape, and Kick questionnaire    Fear of Current or Ex-Partner: No    Emotionally Abused: No    Physically Abused: No    Sexually Abused: No    FAMILY HISTORY: Family History  Problem Relation Age of Onset   Arthritis/Rheumatoid Mother    Diabetes Father    Breast cancer Paternal Aunt     ALLERGIES:  has No Known Allergies.  MEDICATIONS:  Current Outpatient Medications  Medication Sig Dispense Refill   Cholecalciferol (VITAMIN D) 125 MCG (5000 UT) CAPS Take 5,000 Units by mouth daily.     escitalopram (LEXAPRO) 20 MG tablet Take 20 mg by mouth daily.     fluticasone (FLONASE) 50 MCG/ACT nasal spray Place 1 spray into both nostrils daily as needed for allergies or rhinitis.     Naphazoline-Pheniramine (OPCON-A) 0.027-0.315 % SOLN Place 1 drop into both eyes daily as needed (allergies).     Norethindrone Acetate-Ethinyl Estrad-FE (BLISOVI 24 FE) 1-20 MG-MCG(24) tablet Take 1 tablet by mouth daily.     No current facility-administered medications for this visit.    REVIEW OF SYSTEMS:   Constitutional: Denies fevers, chills or abnormal night sweats Eyes: Denies blurriness of vision, double vision or watery eyes Ears, nose, mouth, throat, and face: Denies mucositis or sore throat Respiratory: Denies cough, dyspnea or wheezes Cardiovascular: Denies palpitation, chest discomfort or lower extremity swelling Gastrointestinal:  Denies nausea, heartburn or change in bowel habits Skin: Denies abnormal skin rashes Lymphatics: Denies new  lymphadenopathy or easy bruising Neurological:Denies numbness, tingling or new weaknesses Behavioral/Psych: Mood is stable, no new changes  All other systems were reviewed with the patient and are negative.  PHYSICAL EXAMINATION: ECOG PERFORMANCE STATUS: {CHL ONC ECOG PS:[phone]}  There were no vitals filed for this visit. There were no vitals filed for this visit.  GENERAL:alert, no distress and comfortable SKIN: skin color, texture, turgor are normal, no rashes or significant lesions EYES: normal, conjunctiva are pink and non-injected, sclera clear OROPHARYNX:no exudate, no erythema and lips, buccal mucosa, and tongue normal  NECK: supple, thyroid normal size, non-tender, without nodularity LYMPH:  no palpable lymphadenopathy in the cervical, axillary or inguinal LUNGS: clear to auscultation and percussion with normal breathing effort HEART: regular rate & rhythm and no murmurs and no lower extremity edema ABDOMEN:abdomen soft, non-tender and normal bowel sounds Musculoskeletal:no cyanosis of digits and no clubbing  PSYCH: alert & oriented x  3 with fluent speech NEURO: no focal motor/sensory deficits  LABORATORY DATA:  I have reviewed the data as listed Lab Results  Component Value Date   WBC 6.2 07/20/2023   HGB 14.6 07/20/2023   HCT 45.3 07/20/2023   MCV 95.6 07/20/2023   PLT 306 07/20/2023   Lab Results  Component Value Date   NA 138 07/20/2023   K 4.1 07/20/2023   CL 105 07/20/2023   CO2 22 07/20/2023    RADIOGRAPHIC STUDIES: I have personally reviewed the radiological images as listed and agreed with the findings in the report. MM RT BREAST BX W LOC DEV 1ST LESION IMAGE BX SPEC STEREO GUIDE  Addendum Date: 06/27/2023   ADDENDUM REPORT: 06/27/2023 13:44 ADDENDUM: PATHOLOGY revealed: Site Breast, RIGHT, needle core biopsy, upper inner, ribbon clip DUCTAL CARCINOMA IN SITU, CRIBRIFORM, INTERMEDIATE NUCLEAR GRADE NECROSIS: PRESENT CALCIFICATIONS: PRESENT DCIS  LENGTH: 0.7 CM Pathology results are CONCORDANT with imaging findings, per Dr. Emmaline Kluver. Pathology results and recommendations below were discussed with patient by telephone on 06/27/2023. Patient reported biopsy site with slight tenderness at the site. Post biopsy care instructions were reviewed, questions were answered and my direct phone number was provided to patient. Patient was instructed to call the [facility] if any concerns or questions arise related to the biopsy. RECOMMENDATION: Surgical consultation has been arranged for patient to see Dr. Magnus Ivan at [facility] on 07/03/2023. Pathology results reported by Lynett Grimes, RN on 06/27/2023. Electronically Signed   By: Emmaline Kluver M.D.   On: 06/27/2023 13:44   Result Date: 06/27/2023 CLINICAL DATA:  47 year old female presenting for biopsy of calcifications in the right breast. EXAM: RIGHT BREAST STEREOTACTIC CORE NEEDLE BIOPSY COMPARISON:  Previous exam(s). FINDINGS: The patient and I discussed the procedure of stereotactic-guided biopsy including benefits and alternatives. We discussed the high likelihood of a successful procedure. We discussed the risks of the procedure including infection, bleeding, tissue injury, clip migration, and inadequate sampling. Informed written consent was given. The usual time out protocol was performed immediately prior to the procedure. Using sterile technique and 1% Lidocaine as local anesthetic, under stereotactic guidance, a 9 gauge vacuum assisted device was used to perform core needle biopsy of calcifications in the upper inner right breast using a superior approach. Specimen radiograph was performed showing at least 2 specimens with calcifications. Specimens with calcifications are identified for pathology. Lesion quadrant: Upper inner quadrant At the conclusion of the procedure, a ribbon shaped tissue marker clip was deployed into the biopsy cavity. Follow-up 2-view  mammogram was performed and dictated separately. IMPRESSION: Stereotactic-guided biopsy of calcifications in the upper inner right breast. No apparent complications. Electronically Signed: By: Emmaline Kluver M.D. On: 06/26/2023 11:56   MM CLIP PLACEMENT RIGHT  Result Date: 06/26/2023 CLINICAL DATA:  Post procedure mammogram for clip placement EXAM: 3D DIAGNOSTIC RIGHT MAMMOGRAM POST STEREOTACTIC BIOPSY COMPARISON:  Previous exam(s). FINDINGS: 3D Mammographic images were obtained following stereotactic guided biopsy of calcifications in the upper inner right breast. The ribbon biopsy marking clip is in expected position at the site of biopsy. IMPRESSION: Appropriate positioning of the ribbon shaped biopsy marking clip at the site of biopsy in the upper inner right breast. Please note the post biopsy films were performed during and IT down time and the ML view is blurry. The clip is likely within the appropriate position. If localization is necessary recommend repeating the ML view prior to the procedure to ensure appropriate clip placement. Final Assessment: Post  Procedure Mammograms for Marker Placement BI-RADS CATEGORY  Post-Procedure Mammogram for Marker Placement Electronically Signed   By: Emmaline Kluver M.D.   On: 06/26/2023 11:55

## 2023-07-21 NOTE — Plan of Care (Signed)
CHL Tonsillectomy/Adenoidectomy, Postoperative PEDS care plan entered in error.

## 2023-07-24 ENCOUNTER — Inpatient Hospital Stay: Payer: 59

## 2023-07-24 ENCOUNTER — Inpatient Hospital Stay: Payer: 59 | Attending: Nurse Practitioner | Admitting: Nurse Practitioner

## 2023-07-24 ENCOUNTER — Other Ambulatory Visit: Payer: Self-pay

## 2023-07-24 VITALS — BP 143/87 | HR 76 | Temp 98.4°F | Resp 18 | Ht 66.5 in | Wt 222.3 lb

## 2023-07-24 DIAGNOSIS — Z79899 Other long term (current) drug therapy: Secondary | ICD-10-CM | POA: Diagnosis not present

## 2023-07-24 DIAGNOSIS — D0511 Intraductal carcinoma in situ of right breast: Secondary | ICD-10-CM | POA: Insufficient documentation

## 2023-07-24 DIAGNOSIS — N920 Excessive and frequent menstruation with regular cycle: Secondary | ICD-10-CM | POA: Insufficient documentation

## 2023-07-24 NOTE — Assessment & Plan Note (Addendum)
Recently diagnosed with DCIS, intermediate grade, right breast.  This is strongly ER+ and PR+. She is scheduled to have radioactive seed lumpectomy on 07/27/2023 then radiation of effected area for 20 treatments. Recommendation to take estrogen blocker, tamoxifen 5 mg, for 3-5 years to prevent recurrence.

## 2023-07-26 ENCOUNTER — Inpatient Hospital Stay
Admission: RE | Admit: 2023-07-26 | Discharge: 2023-07-26 | Disposition: A | Discharge status: Home / Self Care | Payer: 59 | Source: Ambulatory Visit | Attending: Surgery | Admitting: Surgery

## 2023-07-26 ENCOUNTER — Encounter: Payer: Self-pay | Admitting: *Deleted

## 2023-07-26 ENCOUNTER — Encounter (HOSPITAL_COMMUNITY)
Admission: RE | Admit: 2023-07-26 | Discharge: 2023-07-26 | Disposition: A | Discharge status: Home / Self Care | Payer: 59 | Source: Ambulatory Visit | Attending: Surgery | Admitting: Surgery

## 2023-07-26 DIAGNOSIS — Z01818 Encounter for other preprocedural examination: Secondary | ICD-10-CM

## 2023-07-26 DIAGNOSIS — D0511 Intraductal carcinoma in situ of right breast: Secondary | ICD-10-CM

## 2023-07-26 DIAGNOSIS — Z01812 Encounter for preprocedural laboratory examination: Secondary | ICD-10-CM | POA: Diagnosis not present

## 2023-07-26 HISTORY — PX: BREAST BIOPSY: SHX20

## 2023-07-26 LAB — POCT PREGNANCY, URINE: Preg Test, Ur: NEGATIVE

## 2023-07-26 NOTE — Progress Notes (Signed)
Pt came in to PAT today and had a urine pregnancy test.

## 2023-07-26 NOTE — H&P (Signed)
REFERRING PHYSICIAN: Lajuana Matte, MD PROVIDER: Wayne Both, MD MRN: [id number] DOB: 1976/02/15  Subjective   Chief Complaint: new breast cancer  History of Present Illness: Sydney Giles is a 47 y.o. female who is seenas an office consultation for evaluation of new breast cancer  This is a 47 year old female who is found to have abnormal calcifications in screening mammography of her right breast recently. She underwent a biopsy of the right breast in the upper inner quadrant. This showed intermediate grade ductal carcinoma in situ which was 95% ER +100% PR positive. The total area measured 1.2 cm. She has had no previous problems regarding her breast. She denies nipple discharge. There is no family history of breast cancer. She is otherwise healthy and has no cardiopulmonary issues  Review of Systems: A complete review of systems was obtained from the patient. I have reviewed this information and discussed as appropriate with the patient. See HPI as well for other ROS.  ROS   Medical History: Past Medical History:  Diagnosis Date  Anxiety   There is no problem list on file for this patient.  History reviewed. No pertinent surgical history.   No Known Allergies  Current Outpatient Medications on File Prior to Visit  Medication Sig Dispense Refill  BLISOVI FE 1/20, 28, 1 mg-20 mcg (21)/75 mg (7) tablet Take 1 tablet by mouth once daily  escitalopram oxalate (LEXAPRO) 20 MG tablet Take 1 tablet by mouth once daily  fluticasone propionate (FLONASE) 50 mcg/actuation nasal spray USE 1 TO 2 SPRAYS IN EACH NOSTRIL 2 TIMES DAILY  valACYclovir (VALTREX) 1000 MG tablet TAKE 1 TABLET BY MOUTH TWICE A DAY FOR 5 DAYS FOR OUTBREAKS, THEN 1/2 TABLET TWICE A DAY FOR PROPHYLAXIS OR RECURRENT OUTBREAKS   No current facility-administered medications on file prior to visit.   History reviewed. No pertinent family history.   Social History   Tobacco Use  Smoking Status Never   Smokeless Tobacco Never    Social History   Socioeconomic History  Marital status: Married  Tobacco Use  Smoking status: Never  Smokeless tobacco: Never  Substance and Sexual Activity  Alcohol use: Never  Drug use: Never   Objective:   Vitals:   BP: 138/86  Pulse: 88  Temp: 36.6 C (97.8 F)  SpO2: 99%  Weight: 100.5 kg (221 lb 9.6 oz)  Height: 167.6 cm (5\' 6" )  PainSc: 0-No pain   Body mass index is 35.77 kg/m.  Physical Exam   She appears well on exam  There are no palpable breast masses. She is healed from the small biopsy. The nipple areolar complexes are normal.  There is no axillary adenopathy  Labs, Imaging and Diagnostic Testing: I have reviewed her mammograms, ultrasound, and pathology results  Assessment and Plan:   Diagnoses and all orders for this visit:  Neoplasm of right breast, primary tumor staging category Tis: ductal carcinoma in situ (DCIS) - Ambulatory Referral to Oncology-Medical - Ambulatory Referral to Radiation Oncology   I gave the patient and her husband a copy the pathology results. We discussed the multidisciplinary approach to breast cancer treatment. From a surgical standpoint we then discussed breast conservation versus mastectomy regarding DCIS of the right breast. She is interested in breast conservation. We next discussed proceeding with a radioactive seed guided right breast lumpectomy. I explained the surgical procedure in detail and drew them pictures. I discussed the risk of surgery which includes but is not limited to bleeding, infection, injury to surrounding structures, the  need for further surgery if margins are positive, cardiopulmonary issues, postoperative recovery, etc. She will be referred to the cancer center to see medical and radiation oncology as well for their opinions. She agrees with the plans and surgery will be scheduled

## 2023-07-27 ENCOUNTER — Ambulatory Visit (HOSPITAL_BASED_OUTPATIENT_CLINIC_OR_DEPARTMENT_OTHER): Payer: 59 | Admitting: Anesthesiology

## 2023-07-27 ENCOUNTER — Other Ambulatory Visit: Payer: Self-pay

## 2023-07-27 ENCOUNTER — Ambulatory Visit (HOSPITAL_COMMUNITY): Payer: 59 | Admitting: Physician Assistant

## 2023-07-27 ENCOUNTER — Ambulatory Visit (HOSPITAL_COMMUNITY)
Admission: RE | Admit: 2023-07-27 | Discharge: 2023-07-27 | Disposition: A | Discharge status: Home / Self Care | Payer: 59 | Source: Ambulatory Visit | Attending: Surgery | Admitting: Surgery

## 2023-07-27 ENCOUNTER — Ambulatory Visit
Admission: RE | Admit: 2023-07-27 | Discharge: 2023-07-27 | Disposition: A | Discharge status: Home / Self Care | Payer: 59 | Source: Ambulatory Visit | Attending: Surgery | Admitting: Surgery

## 2023-07-27 ENCOUNTER — Encounter (HOSPITAL_COMMUNITY)
Admission: RE | Disposition: A | Discharge status: Home / Self Care | Payer: Self-pay | Source: Ambulatory Visit | Attending: Surgery

## 2023-07-27 DIAGNOSIS — Z170 Estrogen receptor positive status [ER+]: Secondary | ICD-10-CM | POA: Insufficient documentation

## 2023-07-27 DIAGNOSIS — D0511 Intraductal carcinoma in situ of right breast: Secondary | ICD-10-CM

## 2023-07-27 DIAGNOSIS — I10 Essential (primary) hypertension: Secondary | ICD-10-CM | POA: Diagnosis not present

## 2023-07-27 HISTORY — PX: BREAST LUMPECTOMY WITH RADIOACTIVE SEED LOCALIZATION: SHX6424

## 2023-07-27 LAB — POCT PREGNANCY, URINE: Preg Test, Ur: NEGATIVE

## 2023-07-27 SURGERY — BREAST LUMPECTOMY WITH RADIOACTIVE SEED LOCALIZATION
Anesthesia: General | Site: Breast | Laterality: Right

## 2023-07-27 MED ORDER — FENTANYL CITRATE (PF) 250 MCG/5ML IJ SOLN
INTRAMUSCULAR | Status: DC | PRN
  Administered 2023-07-27 (×2): 25 ug via INTRAVENOUS
  Administered 2023-07-27 (×2): 50 ug via INTRAVENOUS

## 2023-07-27 MED ORDER — ACETAMINOPHEN 500 MG PO TABS: 1000.0000 mg | ORAL_TABLET | ORAL | Status: AC

## 2023-07-27 MED ORDER — ORAL CARE MOUTH RINSE: 15.0000 mL | Freq: Once | OROMUCOSAL | Status: AC

## 2023-07-27 MED ORDER — CEFAZOLIN SODIUM-DEXTROSE 2-4 GM/100ML-% IV SOLN
INTRAVENOUS | Status: AC
  Filled 2023-07-27: qty 100

## 2023-07-27 MED ORDER — LACTATED RINGERS IV SOLN: INTRAVENOUS | Status: DC

## 2023-07-27 MED ORDER — SODIUM CHLORIDE 0.9 % IV SOLN: INTRAVENOUS | Status: DC | PRN

## 2023-07-27 MED ORDER — BUPIVACAINE-EPINEPHRINE 0.25% -1:200000 IJ SOLN
INTRAMUSCULAR | Status: DC | PRN
  Administered 2023-07-27: 17 mL

## 2023-07-27 MED ORDER — CHLORHEXIDINE GLUCONATE 0.12 % MT SOLN
OROMUCOSAL | Status: AC
  Administered 2023-07-27: 15 mL via OROMUCOSAL
  Filled 2023-07-27: qty 15

## 2023-07-27 MED ORDER — FENTANYL CITRATE (PF) 250 MCG/5ML IJ SOLN
INTRAMUSCULAR | Status: AC
  Filled 2023-07-27: qty 5

## 2023-07-27 MED ORDER — ACETAMINOPHEN 500 MG PO TABS
ORAL_TABLET | ORAL | Status: AC
  Administered 2023-07-27: 1000 mg via ORAL
  Filled 2023-07-27: qty 2

## 2023-07-27 MED ORDER — DEXAMETHASONE SODIUM PHOSPHATE 10 MG/ML IJ SOLN
INTRAMUSCULAR | Status: DC | PRN
  Administered 2023-07-27: 10 mg via INTRAVENOUS

## 2023-07-27 MED ORDER — ONDANSETRON HCL 4 MG/2ML IJ SOLN
INTRAMUSCULAR | Status: DC | PRN
  Administered 2023-07-27: 4 mg via INTRAVENOUS

## 2023-07-27 MED ORDER — CHLORHEXIDINE GLUCONATE CLOTH 2 % EX PADS: 6.0000 | MEDICATED_PAD | Freq: Once | CUTANEOUS | Status: DC

## 2023-07-27 MED ORDER — MIDAZOLAM HCL 2 MG/2ML IJ SOLN
INTRAMUSCULAR | Status: DC | PRN
  Administered 2023-07-27: 2 mg via INTRAVENOUS

## 2023-07-27 MED ORDER — CHLORHEXIDINE GLUCONATE 0.12 % MT SOLN: 15.0000 mL | Freq: Once | OROMUCOSAL | Status: AC

## 2023-07-27 MED ORDER — ENSURE PRE-SURGERY PO LIQD: 296.0000 mL | Freq: Once | ORAL | Status: DC

## 2023-07-27 MED ORDER — BUPIVACAINE-EPINEPHRINE (PF) 0.25% -1:200000 IJ SOLN
INTRAMUSCULAR | Status: AC
  Filled 2023-07-27: qty 30

## 2023-07-27 MED ORDER — CEFAZOLIN SODIUM-DEXTROSE 2-4 GM/100ML-% IV SOLN
2.0000 g | INTRAVENOUS | Status: AC
  Administered 2023-07-27: 2 g via INTRAVENOUS

## 2023-07-27 MED ORDER — 0.9 % SODIUM CHLORIDE (POUR BTL) OPTIME
TOPICAL | Status: DC | PRN
  Administered 2023-07-27: 1000 mL

## 2023-07-27 MED ORDER — OXYCODONE HCL 5 MG/5ML PO SOLN
ORAL | Status: AC
  Filled 2023-07-27: qty 5

## 2023-07-27 MED ORDER — OXYCODONE HCL 5 MG/5ML PO SOLN
5.0000 mg | Freq: Once | ORAL | Status: AC
  Administered 2023-07-27: 5 mg via ORAL

## 2023-07-27 MED ORDER — MIDAZOLAM HCL 2 MG/2ML IJ SOLN
INTRAMUSCULAR | Status: AC
Start: 2023-07-27 — End: ?
  Filled 2023-07-27: qty 2

## 2023-07-27 MED ORDER — TRAMADOL HCL 50 MG PO TABS: 50.0000 mg | ORAL_TABLET | Freq: Four times a day (QID) | ORAL | 0 refills | Status: DC | PRN

## 2023-07-27 MED ORDER — PROPOFOL 10 MG/ML IV BOLUS
INTRAVENOUS | Status: DC | PRN
  Administered 2023-07-27: 100 mg via INTRAVENOUS
  Administered 2023-07-27: 50 mg via INTRAVENOUS

## 2023-07-27 MED ORDER — LIDOCAINE 2% (20 MG/ML) 5 ML SYRINGE
INTRAMUSCULAR | Status: DC | PRN
  Administered 2023-07-27: 100 mg via INTRAVENOUS

## 2023-07-27 SURGICAL SUPPLY — 36 items
ADH SKN CLS APL DERMABOND .7 (GAUZE/BANDAGES/DRESSINGS) ×1
APL PRP STRL LF DISP 70% ISPRP (MISCELLANEOUS) ×1
APPLIER CLIP 9.375 MED OPEN (MISCELLANEOUS) ×1
APR CLP MED 9.3 20 MLT OPN (MISCELLANEOUS) ×1
BAG COUNTER SPONGE SURGICOUNT (BAG) ×2 IMPLANT
BAG SPNG CNTER NS LX DISP (BAG) ×1
BINDER BREAST LRG (GAUZE/BANDAGES/DRESSINGS) IMPLANT
BINDER BREAST XLRG (GAUZE/BANDAGES/DRESSINGS) IMPLANT
CANISTER SUCT 3000ML PPV (MISCELLANEOUS) ×2 IMPLANT
CHLORAPREP W/TINT 26 (MISCELLANEOUS) ×2 IMPLANT
CLIP APPLIE 9.375 MED OPEN (MISCELLANEOUS) ×2 IMPLANT
COVER PROBE W GEL 5X96 (DRAPES) ×2 IMPLANT
COVER SURGICAL LIGHT HANDLE (MISCELLANEOUS) ×2 IMPLANT
DERMABOND ADVANCED .7 DNX12 (GAUZE/BANDAGES/DRESSINGS) ×2 IMPLANT
DEVICE DUBIN SPECIMEN MAMMOGRA (MISCELLANEOUS) ×2 IMPLANT
DRAPE CHEST BREAST 15X10 FENES (DRAPES) ×2 IMPLANT
ELECT CAUTERY BLADE 6.4 (BLADE) ×2 IMPLANT
ELECT REM PT RETURN 9FT ADLT (ELECTROSURGICAL) ×1
ELECTRODE REM PT RTRN 9FT ADLT (ELECTROSURGICAL) ×2 IMPLANT
GAUZE PAD ABD 8X10 STRL (GAUZE/BANDAGES/DRESSINGS) IMPLANT
GLOVE SURG SIGNA 7.5 PF LTX (GLOVE) ×2 IMPLANT
GOWN STRL REUS W/ TWL LRG LVL3 (GOWN DISPOSABLE) ×2 IMPLANT
GOWN STRL REUS W/ TWL XL LVL3 (GOWN DISPOSABLE) ×2 IMPLANT
GOWN STRL REUS W/TWL LRG LVL3 (GOWN DISPOSABLE) ×1
GOWN STRL REUS W/TWL XL LVL3 (GOWN DISPOSABLE) ×1
KIT BASIN OR (CUSTOM PROCEDURE TRAY) ×2 IMPLANT
KIT MARKER MARGIN INK (KITS) ×2 IMPLANT
NDL HYPO 25GX1X1/2 BEV (NEEDLE) ×2 IMPLANT
NEEDLE HYPO 25GX1X1/2 BEV (NEEDLE) ×1
NS IRRIG 1000ML POUR BTL (IV SOLUTION) IMPLANT
PACK GENERAL/GYN (CUSTOM PROCEDURE TRAY) ×2 IMPLANT
SUT MNCRL AB 4-0 PS2 18 (SUTURE) ×2 IMPLANT
SUT VIC AB 3-0 SH 18 (SUTURE) ×2 IMPLANT
SYR CONTROL 10ML LL (SYRINGE) ×2 IMPLANT
TOWEL GREEN STERILE (TOWEL DISPOSABLE) ×2 IMPLANT
TOWEL GREEN STERILE FF (TOWEL DISPOSABLE) ×2 IMPLANT

## 2023-07-27 NOTE — Transfer of Care (Signed)
Immediate Anesthesia Transfer of Care Note  Patient: Sydney Giles  Procedure(s) Performed: RADIOACTIVE SEED GUIDED RIGHT BREAST LUMPECTOMY (Right: Breast)  Patient Location: PACU  Anesthesia Type:General  Level of Consciousness: awake  Airway & Oxygen Therapy: Patient Spontanous Breathing  Post-op Assessment: Post -op Vital signs reviewed and stable  Post vital signs: Reviewed and stable  Last Vitals:  Vitals Value Taken Time  BP 134/76 07/27/23 1031  Temp 36.8 C 07/27/23 1031  Pulse 81 07/27/23 1034  Resp 14 07/27/23 1034  SpO2 97 % 07/27/23 1034  Vitals shown include unfiled device data.  Last Pain:  Vitals:   07/27/23 0857  TempSrc:   PainSc: 0-No pain         Complications: There were no known notable events for this encounter.

## 2023-07-27 NOTE — Op Note (Signed)
RADIOACTIVE SEED GUIDED RIGHT BREAST LUMPECTOMY  Procedure Note  Sydney Giles 07/27/2023   Pre-op Diagnosis: RIGHT BREAST DUCTAL CARCINOMA IN SITU     Post-op Diagnosis: same  Procedure(s): RADIOACTIVE SEED GUIDED RIGHT BREAST LUMPECTOMY  Surgeon(s): Abigail Miyamoto, MD  Anesthesia: General  Staff:  Circulator: Pietro Cassis, RN Scrub Person: Carmela Rima  Estimated Blood Loss: Minimal               Specimens: sent to path  Indications: This is a 47 year old female who was found on recent screen mammography to have calcifications in the upper inner quadrant of the right breast.  This measured approximate 1.2 cm.  She underwent a biopsy showing ductal carcinoma in situ.  The decision was made to proceed with a radioactive seed guided right breast lumpectomy  Procedure: The patient is brought to the operating identifies correct patient.  She is placed upon the operating table and general anesthesia was induced.  Her right breast was then prepped and draped in usual sterile fashion.  I anesthetized the skin around the medial edge of the areola with Marcaine.  I then made a circumareolar incision with a scalpel.  With the neoprobe I located the radioactive seed in the upper inner quadrant of the right breast.  I dissected down to the breast tissue with the cautery and then medially toward the signal from the radioactive seed.  This was in the deeper breast tissue.  Using the neoprobe I stayed widely around the seed's signal.  I took the dissection all way down to the chest wall and widely excised the breast tissue around this area of the lumpectomy.  Once the specimen was removed I marked all margins with paint.  An x-ray was performed confirming the radioactive seed and biopsy clip were in the specimen.  I elected to take further medial margin which was sent separately.  Hemostasis was then achieved with the cautery.  I anesthetized the wound further with Marcaine.  I placed  surgical clips around the periphery of the lumpectomy site.  I then closed the subcutaneous tissue with interrupted 3-0 Vicryl sutures and closed the skin with a running 4-0 Monocryl.  Dermabond was then applied.  The patient tolerated the procedure well.  All the counts were correct at the end of the procedure.  The patient was then extubated in the operating room and taken in a stable condition to the recovery room after being placed in a breast binder.           Abigail Miyamoto   Date: 07/27/2023  Time: 10:20 AM

## 2023-07-27 NOTE — Anesthesia Postprocedure Evaluation (Signed)
Anesthesia Post Note  Patient: Sydney Giles  Procedure(s) Performed: RADIOACTIVE SEED GUIDED RIGHT BREAST LUMPECTOMY (Right: Breast)     Patient location during evaluation: PACU Anesthesia Type: General Level of consciousness: awake and alert Pain management: pain level controlled Vital Signs Assessment: post-procedure vital signs reviewed and stable Respiratory status: spontaneous breathing, nonlabored ventilation, respiratory function stable and patient connected to nasal cannula oxygen Cardiovascular status: blood pressure returned to baseline and stable Postop Assessment: no apparent nausea or vomiting Anesthetic complications: no   There were no known notable events for this encounter.  Last Vitals:  Vitals:   07/27/23 1100 07/27/23 1103  BP: 122/78 128/68  Pulse: 84 81  Resp: 15 (!) 22  Temp:  36.6 C  SpO2: 97% 96%    Last Pain:  Vitals:   07/27/23 0857  TempSrc:   PainSc: 0-No pain                 Mariann Barter

## 2023-07-27 NOTE — Anesthesia Procedure Notes (Signed)
Procedure Name: LMA Insertion Date/Time: 07/27/2023 9:45 AM  Performed by: Hessie Diener, CRNAPre-anesthesia Checklist: Patient identified, Emergency Drugs available, Suction available and Patient being monitored Patient Re-evaluated:Patient Re-evaluated prior to induction Oxygen Delivery Method: Circle System Utilized Preoxygenation: Pre-oxygenation with 100% oxygen Induction Type: IV induction Ventilation: Mask ventilation without difficulty LMA: LMA inserted LMA Size: 4.0 Number of attempts: 1 Airway Equipment and Method: Bite block Placement Confirmation: positive ETCO2 Tube secured with: Tape Dental Injury: Teeth and Oropharynx as per pre-operative assessment

## 2023-07-27 NOTE — Interval H&P Note (Signed)
History and Physical Interval Note: no change in H and P  07/27/2023 8:28 AM  Sydney Giles  has presented today for surgery, with the diagnosis of RIGHT BREAST DUCTAL CARCINOMA IN SITU.  The various methods of treatment have been discussed with the patient and family. After consideration of risks, benefits and other options for treatment, the patient has consented to  Procedure(s) with comments: RADIOACTIVE SEED GUIDED RIGHT BREAST LUMPECTOMY (Right) - LMA as a surgical intervention.  The patient's history has been reviewed, patient examined, no change in status, stable for surgery.  I have reviewed the patient's chart and labs.  Questions were answered to the patient's satisfaction.     Abigail Miyamoto

## 2023-07-27 NOTE — Discharge Instructions (Signed)
[facility] Office Phone Number [phone]  BREAST BIOPSY/ PARTIAL MASTECTOMY: POST OP INSTRUCTIONS  Always review your discharge instruction sheet given to you by the facility where your surgery was performed.  IF YOU HAVE DISABILITY OR FAMILY LEAVE FORMS, YOU MUST BRING THEM TO THE OFFICE FOR PROCESSING.  DO NOT GIVE THEM TO YOUR DOCTOR.  A prescription for pain medication may be given to you upon discharge.  Take your pain medication as prescribed, if needed.  If narcotic pain medicine is not needed, then you may take acetaminophen (Tylenol) or ibuprofen (Advil) as needed. Take your usually prescribed medications unless otherwise directed If you need a refill on your pain medication, please contact your pharmacy.  They will contact our office to request authorization.  Prescriptions will not be filled after 5pm or on week-ends. You should eat very light the first 24 hours after surgery, such as soup, crackers, pudding, etc.  Resume your normal diet the day after surgery. Most patients will experience some swelling and bruising in the breast.  Ice packs and a good support bra will help.  Swelling and bruising can take several days to resolve.  It is common to experience some constipation if taking pain medication after surgery.  Increasing fluid intake and taking a stool softener will usually help or prevent this problem from occurring.  A mild laxative (Milk of Magnesia or Miralax) should be taken according to package directions if there are no bowel movements after 48 hours. Unless discharge instructions indicate otherwise, you may remove your bandages 24-48 hours after surgery, and you may shower at that time.  You may have steri-strips (small skin tapes) in place directly over the incision.  These strips should be left on the skin for 7-10 days.  If your surgeon used skin glue on the incision, you may shower in 24 hours.  The glue will flake off over the next 2-3 weeks.  Any  sutures or staples will be removed at the office during your follow-up visit. ACTIVITIES:  You may resume regular daily activities (gradually increasing) beginning the next day.  Wearing a good support bra or sports bra minimizes pain and swelling.  You may have sexual intercourse when it is comfortable. You may drive when you no longer are taking prescription pain medication, you can comfortably wear a seatbelt, and you can safely maneuver your car and apply brakes. RETURN TO WORK:  ______________________________________________________________________________________ Sydney Giles should see your doctor in the office for a follow-up appointment approximately two weeks after your surgery.  Your doctor's nurse will typically make your follow-up appointment when she calls you with your pathology report.  Expect your pathology report 2-3 business days after your surgery.  You may call to check if you do not hear from [place] after three days. OTHER INSTRUCTIONS: _YOU MAY REMOVE THE BINDER TOMORROW AND SHOWER THEN WEAR WHAT EVER MAKES YOU THE MOST COMFORTABLE ICE PACK,TYLENOL, AND IBUPROFEN ALSO FOR PAIN NO VIGOROUS ACTIVITY FOR ONE WEEK ______________________________________________________________________________________________ _____________________________________________________________________________________________________________________________________ _____________________________________________________________________________________________________________________________________ _____________________________________________________________________________________________________________________________________  WHEN TO CALL YOUR DOCTOR: Fever over 101.0 Nausea and/or vomiting. Extreme swelling or bruising. Continued bleeding from incision. Increased pain, redness, or drainage from the incision.  The clinic staff is available to answer your questions during regular business hours.  Please don't  hesitate to call and ask to speak to one of the nurses for clinical concerns.  If you have a medical emergency, go to the nearest emergency room or call 911.  A surgeon from [facility] is always  on call at the hospital.  For further questions, please visit [email or web address]

## 2023-07-27 NOTE — Anesthesia Preprocedure Evaluation (Signed)
Anesthesia Evaluation  Patient identified by MRN, date of birth, ID band Patient awake    Reviewed: Allergy & Precautions, NPO status , Patient's Chart, lab work & pertinent test results, reviewed documented beta blocker date and time   History of Anesthesia Complications (+) PONV and history of anesthetic complications  Airway Mallampati: I  TM Distance: >3 FB     Dental no notable dental hx.    Pulmonary neg COPD   breath sounds clear to auscultation       Cardiovascular hypertension, Pt. on medications (-) angina (-) CAD, (-) Past MI, (-) Cardiac Stents and (-) CHF  Rhythm:Regular Rate:Normal     Neuro/Psych neg Seizures  Anxiety        GI/Hepatic ,neg GERD  ,,(+) neg Cirrhosis        Endo/Other  neg diabetes    Renal/GU Renal disease     Musculoskeletal   Abdominal   Peds  Hematology   Anesthesia Other Findings   Reproductive/Obstetrics                             Anesthesia Physical Anesthesia Plan  ASA: 2  Anesthesia Plan: General   Post-op Pain Management:    Induction: Intravenous  PONV Risk Score and Plan: 3 and Propofol infusion, Dexamethasone and Ondansetron  Airway Management Planned: LMA  Additional Equipment:   Intra-op Plan:   Post-operative Plan: Extubation in OR  Informed Consent: I have reviewed the patients History and Physical, chart, labs and discussed the procedure including the risks, benefits and alternatives for the proposed anesthesia with the patient or authorized representative who has indicated his/her understanding and acceptance.     Dental advisory given  Plan Discussed with: CRNA  Anesthesia Plan Comments:        Anesthesia Quick Evaluation

## 2023-07-28 ENCOUNTER — Encounter (HOSPITAL_COMMUNITY): Payer: Self-pay | Admitting: Surgery

## 2023-07-31 LAB — SURGICAL PATHOLOGY

## 2023-08-02 ENCOUNTER — Encounter: Payer: Self-pay | Admitting: *Deleted

## 2023-08-11 ENCOUNTER — Other Ambulatory Visit: Payer: Self-pay | Admitting: *Deleted

## 2023-08-11 DIAGNOSIS — D0511 Intraductal carcinoma in situ of right breast: Secondary | ICD-10-CM

## 2023-08-14 ENCOUNTER — Telehealth: Payer: Self-pay

## 2023-08-14 NOTE — Telephone Encounter (Signed)
Spoke w/ patient, verified identity x2 and began call. As per Laurence Aly PA-C. I have reached out to patient to inquire about her current pregnancy status. Patient states "No chances of pregnancy and no sexual activity since her surgery (Lumpectomy) on 07/27/2023, but that she will still take a pregnancy test to be on the safe side." I told patient that was a good idea. She verbalized understanding.  This concludes the interaction.  Ruel Favors, LPN

## 2023-08-15 ENCOUNTER — Ambulatory Visit
Admission: RE | Admit: 2023-08-15 | Discharge: 2023-08-15 | Disposition: A | Discharge status: Home / Self Care | Payer: 59 | Source: Ambulatory Visit | Attending: Radiation Oncology | Admitting: Radiation Oncology

## 2023-08-15 ENCOUNTER — Encounter: Payer: Self-pay | Admitting: Radiation Oncology

## 2023-08-15 VITALS — Ht 66.5 in | Wt 220.0 lb

## 2023-08-15 DIAGNOSIS — D0511 Intraductal carcinoma in situ of right breast: Secondary | ICD-10-CM

## 2023-08-15 NOTE — Progress Notes (Signed)
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  Patient reports doing well. Patient denies any chest/ arm pains, SOB and any other related issues at this time.  Meaningful use complete.  Vitals- Ht 5' 6.5" (1.689 m)   Wt 220 lb (99.8 kg)   LMP 07/16/2023 (Exact Date)   BMI 34.98 kg/m   Last negative pregnancy test on 07/27/2023. No chance of pregnancy- per patient.   This concludes the interaction.  Ruel Favors, LPN

## 2023-08-15 NOTE — Progress Notes (Signed)
Radiation Oncology         [phone] ________________________________   Outpatient Follow Up - Conducted via telephone at patient request.  I spoke with the patient to conduct this visit via telephone. The patient was notified in advance and was offered an in person or telemedicine meeting to allow for face to face communication but instead preferred to proceed with a telephone visit.   Name: Sydney Giles        MRN: [id number]  Date of Service: 08/15/2023 DOB: 04-Sep-1976  EX:[id number], Durward Fortes, MD  Abigail Miyamoto, MD     REFERRING PHYSICIAN: Abigail Miyamoto, MD   DIAGNOSIS: The encounter diagnosis was Ductal carcinoma in situ (DCIS) of right breast.   HISTORY OF PRESENT ILLNESS: Sydney Giles is a 47 y.o. female with a newly diagnosed right breast cancer.  The patient presented with screening mammogram which found calcifications in her right breast and further diagnostic imaging on 06/19/2023 showed an indeterminate group of calcifications in the upper inner quadrant of the right measuring up to 1.2 cm in greatest dimension.  She underwent a stereotactic biopsy on 06/26/2023 that showed an intermediate grade DCIS that had calcifications and necrosis that was ER/PR positive.    Since her last visit, she underwent a right lumpectomy on 07/27/2023. Final pathology showed an intermediate grade DCIS measuring 4 mm in greatest dimension.  The margins were negative, the closest was 2 mm from the anterior margin.  Additional medial margin excision was negative.  She is contacted today to discuss adjuvant radiotherapy.   PREVIOUS RADIATION THERAPY: No   PAST MEDICAL HISTORY:  Past Medical History:  Diagnosis Date   Abnormal Pap smear    Anxiety    Breast cancer (HCC) 07/2023   Ovarian cyst        PAST SURGICAL HISTORY: Past Surgical History:  Procedure Laterality Date   BREAST BIOPSY Right 06/26/2023   MM RT BREAST BX W LOC DEV 1ST LESION IMAGE BX SPEC STEREO GUIDE  06/26/2023 [facility]   BREAST BIOPSY  07/26/2023   MM RT RADIOACTIVE SEED LOC MAMMO GUIDE 07/26/2023 [facility]   BREAST LUMPECTOMY WITH RADIOACTIVE SEED LOCALIZATION Right 07/27/2023   Procedure: RADIOACTIVE SEED GUIDED RIGHT BREAST LUMPECTOMY;  Surgeon: Abigail Miyamoto, MD;  Location: [facility] OR;  Service: General;  Laterality: Right;  LMA   COLPOSCOPY     OVARIAN CYST SURGERY       FAMILY HISTORY:  Family History  Problem Relation Age of Onset   Arthritis/Rheumatoid Mother    Diabetes Father    Breast cancer Paternal Aunt      SOCIAL HISTORY:  reports that she has never smoked. She has never used smokeless tobacco. She reports that she does not drink alcohol and does not use drugs. The patient is married and lives in [place]. 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 She has a son who is 64 and in 5th grade.    ALLERGIES: Patient has no known allergies.   MEDICATIONS:  Current Outpatient Medications  Medication Sig Dispense Refill   Cholecalciferol (VITAMIN D) 125 MCG (5000 UT) CAPS Take 5,000 Units by mouth daily.     escitalopram (LEXAPRO) 20 MG tablet Take 20 mg by mouth daily.     fluticasone (FLONASE) 50 MCG/ACT nasal spray Place 1 spray into both nostrils daily as needed for allergies or rhinitis.     Naphazoline-Pheniramine (OPCON-A) 0.027-0.315 % SOLN Place 1 drop into both eyes daily as needed (  allergies).     No current facility-administered medications for this encounter.     REVIEW OF SYSTEMS: On review of systems, the patient reports that she is doing well overall. She feels like she has some discomfort and bruising still in the right breast since surgery but this has been manageable. No other complaints are verbalized.     PHYSICAL EXAM:  Unable to assess due to encounter type    ECOG = 0  0 - Asymptomatic (Fully active, able to carry on all predisease activities without restriction)  1 - Symptomatic but completely  ambulatory (Restricted in physically strenuous activity but ambulatory and able to carry out work of a light or sedentary nature. For example, light housework, office work)  2 - Symptomatic, <50% in bed during the day (Ambulatory and capable of all self care but unable to carry out any work activities. Up and about more than 50% of waking hours)  3 - Symptomatic, >50% in bed, but not bedbound (Capable of only limited self-care, confined to bed or chair 50% or more of waking hours)  4 - Bedbound (Completely disabled. Cannot carry on any self-care. Totally confined to bed or chair)  5 - Death   Santiago Glad MM, Creech RH, Tormey DC, et al. [phone]). "Toxicity and response criteria of the [facility]". Am. Evlyn Clines. Oncol. 5 (6): 649-55    LABORATORY DATA:  Lab Results  Component Value Date   WBC 6.2 07/20/2023   HGB 14.6 07/20/2023   HCT 45.3 07/20/2023   MCV 95.6 07/20/2023   PLT 306 07/20/2023   Lab Results  Component Value Date   NA 138 07/20/2023   K 4.1 07/20/2023   CL 105 07/20/2023   CO2 22 07/20/2023   Lab Results  Component Value Date   ALT 15 10/04/2022   AST 20 10/04/2022   ALKPHOS 42 10/04/2022   BILITOT 0.4 10/04/2022      RADIOGRAPHY: MM Breast Surgical Specimen  Result Date: 07/27/2023 CLINICAL DATA:  Post right breast excision. EXAM: SPECIMEN RADIOGRAPH OF THE RIGHT BREAST COMPARISON:  Previous exam(s). FINDINGS: Status post excision of the right breast. The radioactive seed and biopsy marker clip are present, completely intact, and were marked for pathology. IMPRESSION: Specimen radiograph of the right breast. Electronically Signed   By: Edwin Cap M.D.   On: 07/27/2023 10:13   MM RT RADIOACTIVE SEED LOC MAMMO GUIDE  Result Date: 07/26/2023 CLINICAL DATA:  Radioactive seed localization prior to surgery. EXAM: MAMMOGRAPHIC GUIDED RADIOACTIVE SEED LOCALIZATION OF THE RIGHT BREAST COMPARISON:  Previous exam(s). FINDINGS: Patient presents  for radioactive seed localization prior to surgery. I met with the patient and we discussed the procedure of seed localization including benefits and alternatives. We discussed the high likelihood of a successful procedure. We discussed the risks of the procedure including infection, bleeding, tissue injury and further surgery. We discussed the low dose of radioactivity involved in the procedure. Informed, written consent was given. The usual time-out protocol was performed immediately prior to the procedure. Using mammographic guidance, sterile technique, 1% lidocaine and an I-125 radioactive seed, the ribbon shaped clip was localized using a medial to lateral approach. The follow-up mammogram images confirm the seed in the expected location and were marked for Dr. Earl Many. Follow-up survey of the patient confirms presence of the radioactive seed. Order number of I-125 seed:  [id number]. Total activity:  0.238 millicuries reference Date: 06/26/2019 The patient tolerated the procedure well and was [facility].  She was given instructions regarding seed removal. IMPRESSION: Radioactive seed localization the right breast. No apparent complications. Electronically Signed   By: Baird Lyons M.D.   On: 07/26/2023 15:10       IMPRESSION/PLAN: 1. 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 Dr. Mitzi Hansen has reviewed her final pathology results.  She has done well since surgery.  Dr. Mitzi Hansen recommends external radiotherapy to the breast to reduce risks of local recurrence. We discussed the risks, benefits, short, and long term effects of radiotherapy, as well as the curative intent, and the patient is interested in proceeding.  We reviewed the delivery and logistics of radiotherapy and Dr. Mitzi Hansen recommends 4 weeks of radiotherapy to the right breast. She will simulate tomorrow at which time she will sign written consent to proceed. 2.   Contraceptive Counseling. She had negative pregnancy  testing at the time of surgery, and has not been active since. We discussed the need to avoid pregnancy which she is in agreement with and has discussed modalities as well with her OB GYN since discontinuing oral contraceptives. No testing is needed given her recent abstinence prior to simulation.  This encounter was conducted via telephone.  The patient has provided two factor identification and has given verbal consent for this type of encounter and has been advised to only accept a meeting of this type in a secure network environment. The time spent during this encounter was 45 minutes including preparation, discussion, and coordination of the patient's care. The attendants for this meeting include Ronny Bacon  and Everardo Beals.  During the encounter,  Ronny Bacon waslocated at [facility].  Everardo Beals was located at work.      Osker Mason, [facility]    **Disclaimer: This note was dictated with voice recognition software. Similar sounding words can inadvertently be transcribed and this note may contain transcription errors which may not have been corrected upon publication of note.**

## 2023-08-16 ENCOUNTER — Ambulatory Visit: Payer: 59 | Admitting: Radiation Oncology

## 2023-08-16 ENCOUNTER — Ambulatory Visit
Admission: RE | Admit: 2023-08-16 | Discharge: 2023-08-16 | Disposition: A | Discharge status: Home / Self Care | Payer: 59 | Source: Ambulatory Visit | Attending: Radiation Oncology | Admitting: Radiation Oncology

## 2023-08-16 ENCOUNTER — Ambulatory Visit: Payer: 59

## 2023-08-16 DIAGNOSIS — Z510 Encounter for antineoplastic radiation therapy: Secondary | ICD-10-CM | POA: Diagnosis present

## 2023-08-16 DIAGNOSIS — D0511 Intraductal carcinoma in situ of right breast: Secondary | ICD-10-CM | POA: Insufficient documentation

## 2023-08-21 ENCOUNTER — Encounter: Payer: Self-pay | Admitting: *Deleted

## 2023-08-21 DIAGNOSIS — D0511 Intraductal carcinoma in situ of right breast: Secondary | ICD-10-CM

## 2023-08-22 ENCOUNTER — Telehealth: Payer: Self-pay | Admitting: Hematology

## 2023-08-22 DIAGNOSIS — Z510 Encounter for antineoplastic radiation therapy: Secondary | ICD-10-CM | POA: Diagnosis not present

## 2023-08-22 NOTE — Telephone Encounter (Signed)
Vm left with patient in regards to scheduling genetics consult.

## 2023-08-24 ENCOUNTER — Ambulatory Visit
Admission: RE | Admit: 2023-08-24 | Discharge: 2023-08-24 | Disposition: A | Discharge status: Home / Self Care | Payer: 59 | Source: Ambulatory Visit | Attending: Radiation Oncology | Admitting: Radiation Oncology

## 2023-08-24 ENCOUNTER — Other Ambulatory Visit: Payer: Self-pay

## 2023-08-24 DIAGNOSIS — Z510 Encounter for antineoplastic radiation therapy: Secondary | ICD-10-CM | POA: Diagnosis not present

## 2023-08-24 LAB — RAD ONC ARIA SESSION SUMMARY
Course Elapsed Days: 0
Plan Fractions Treated to Date: 1
Plan Prescribed Dose Per Fraction: 2.66 Gy
Plan Total Fractions Prescribed: 16
Plan Total Prescribed Dose: 42.56 Gy
Reference Point Dosage Given to Date: 2.66 Gy
Reference Point Session Dosage Given: 2.66 Gy
Session Number: 1

## 2023-08-25 ENCOUNTER — Ambulatory Visit
Admission: RE | Admit: 2023-08-25 | Discharge: 2023-08-25 | Disposition: A | Discharge status: Home / Self Care | Payer: 59 | Source: Ambulatory Visit | Attending: Radiation Oncology | Admitting: Radiation Oncology

## 2023-08-25 ENCOUNTER — Other Ambulatory Visit: Payer: Self-pay

## 2023-08-25 ENCOUNTER — Ambulatory Visit
Admission: RE | Admit: 2023-08-25 | Discharge: 2023-08-25 | Disposition: A | Discharge status: Home / Self Care | Payer: 59 | Source: Ambulatory Visit | Attending: Radiation Oncology

## 2023-08-25 DIAGNOSIS — Z510 Encounter for antineoplastic radiation therapy: Secondary | ICD-10-CM | POA: Diagnosis not present

## 2023-08-25 DIAGNOSIS — D0511 Intraductal carcinoma in situ of right breast: Secondary | ICD-10-CM

## 2023-08-25 LAB — RAD ONC ARIA SESSION SUMMARY
Course Elapsed Days: 1
Plan Fractions Treated to Date: 2
Plan Prescribed Dose Per Fraction: 2.66 Gy
Plan Total Fractions Prescribed: 16
Plan Total Prescribed Dose: 42.56 Gy
Reference Point Dosage Given to Date: 5.32 Gy
Reference Point Session Dosage Given: 2.66 Gy
Session Number: 2

## 2023-08-25 MED ORDER — RADIAPLEXRX EX GEL
Freq: Once | CUTANEOUS | Status: AC
Start: 2023-08-25 — End: 2023-08-25

## 2023-08-25 MED ORDER — ALRA NON-METALLIC DEODORANT (RAD-ONC)
1.0000 | Freq: Once | TOPICAL | Status: AC
  Administered 2023-08-25: 1 via TOPICAL

## 2023-08-26 ENCOUNTER — Telehealth: Payer: Self-pay | Admitting: Hematology

## 2023-08-27 ENCOUNTER — Other Ambulatory Visit: Payer: Self-pay

## 2023-08-27 ENCOUNTER — Ambulatory Visit: Payer: 59

## 2023-08-27 ENCOUNTER — Ambulatory Visit
Admission: RE | Admit: 2023-08-27 | Discharge: 2023-08-27 | Disposition: A | Discharge status: Home / Self Care | Payer: 59 | Source: Ambulatory Visit | Attending: Radiation Oncology | Admitting: Radiation Oncology

## 2023-08-27 DIAGNOSIS — Z510 Encounter for antineoplastic radiation therapy: Secondary | ICD-10-CM | POA: Diagnosis not present

## 2023-08-27 LAB — RAD ONC ARIA SESSION SUMMARY
Course Elapsed Days: 3
Plan Fractions Treated to Date: 3
Plan Prescribed Dose Per Fraction: 2.66 Gy
Plan Total Fractions Prescribed: 16
Plan Total Prescribed Dose: 42.56 Gy
Reference Point Dosage Given to Date: 7.98 Gy
Reference Point Session Dosage Given: 2.66 Gy
Session Number: 3

## 2023-08-28 ENCOUNTER — Other Ambulatory Visit: Payer: Self-pay

## 2023-08-28 ENCOUNTER — Ambulatory Visit: Payer: 59

## 2023-08-28 ENCOUNTER — Ambulatory Visit
Admission: RE | Admit: 2023-08-28 | Discharge: 2023-08-28 | Disposition: A | Discharge status: Home / Self Care | Payer: 59 | Source: Ambulatory Visit | Attending: Radiation Oncology | Admitting: Radiation Oncology

## 2023-08-28 DIAGNOSIS — Z510 Encounter for antineoplastic radiation therapy: Secondary | ICD-10-CM | POA: Diagnosis not present

## 2023-08-28 LAB — RAD ONC ARIA SESSION SUMMARY
Course Elapsed Days: 4
Plan Fractions Treated to Date: 4
Plan Prescribed Dose Per Fraction: 2.66 Gy
Plan Total Fractions Prescribed: 16
Plan Total Prescribed Dose: 42.56 Gy
Reference Point Dosage Given to Date: 10.64 Gy
Reference Point Session Dosage Given: 2.66 Gy
Session Number: 4

## 2023-08-29 ENCOUNTER — Other Ambulatory Visit: Payer: Self-pay

## 2023-08-29 ENCOUNTER — Ambulatory Visit
Admission: RE | Admit: 2023-08-29 | Discharge: 2023-08-29 | Disposition: A | Discharge status: Home / Self Care | Payer: 59 | Source: Ambulatory Visit | Attending: Radiation Oncology | Admitting: Radiation Oncology

## 2023-08-29 DIAGNOSIS — Z510 Encounter for antineoplastic radiation therapy: Secondary | ICD-10-CM | POA: Diagnosis not present

## 2023-08-29 LAB — RAD ONC ARIA SESSION SUMMARY
Course Elapsed Days: 5
Plan Fractions Treated to Date: 5
Plan Prescribed Dose Per Fraction: 2.66 Gy
Plan Total Fractions Prescribed: 16
Plan Total Prescribed Dose: 42.56 Gy
Reference Point Dosage Given to Date: 13.3 Gy
Reference Point Session Dosage Given: 2.66 Gy
Session Number: 5

## 2023-08-30 ENCOUNTER — Ambulatory Visit
Admission: RE | Admit: 2023-08-30 | Discharge: 2023-08-30 | Disposition: A | Discharge status: Home / Self Care | Payer: 59 | Source: Ambulatory Visit | Attending: Radiation Oncology | Admitting: Radiation Oncology

## 2023-08-30 ENCOUNTER — Other Ambulatory Visit: Payer: Self-pay

## 2023-08-30 DIAGNOSIS — Z510 Encounter for antineoplastic radiation therapy: Secondary | ICD-10-CM | POA: Diagnosis not present

## 2023-08-30 LAB — RAD ONC ARIA SESSION SUMMARY
Course Elapsed Days: 6
Plan Fractions Treated to Date: 6
Plan Prescribed Dose Per Fraction: 2.66 Gy
Plan Total Fractions Prescribed: 16
Plan Total Prescribed Dose: 42.56 Gy
Reference Point Dosage Given to Date: 15.96 Gy
Reference Point Session Dosage Given: 2.66 Gy
Session Number: 6

## 2023-09-02 ENCOUNTER — Telehealth: Payer: Self-pay | Admitting: Hematology

## 2023-09-02 NOTE — Telephone Encounter (Signed)
Left messages on 11/23, 11/27, and 11/30 to schedule genetic counseling.   No response.

## 2023-09-04 ENCOUNTER — Other Ambulatory Visit: Payer: Self-pay

## 2023-09-04 ENCOUNTER — Ambulatory Visit
Admission: RE | Admit: 2023-09-04 | Discharge: 2023-09-04 | Disposition: A | Discharge status: Home / Self Care | Payer: 59 | Source: Ambulatory Visit | Attending: Radiation Oncology

## 2023-09-04 ENCOUNTER — Ambulatory Visit
Admission: RE | Admit: 2023-09-04 | Discharge: 2023-09-04 | Disposition: A | Discharge status: Home / Self Care | Payer: 59 | Source: Ambulatory Visit | Attending: Radiation Oncology | Admitting: Radiation Oncology

## 2023-09-04 DIAGNOSIS — D0511 Intraductal carcinoma in situ of right breast: Secondary | ICD-10-CM | POA: Insufficient documentation

## 2023-09-04 DIAGNOSIS — Z510 Encounter for antineoplastic radiation therapy: Secondary | ICD-10-CM | POA: Insufficient documentation

## 2023-09-04 LAB — RAD ONC ARIA SESSION SUMMARY
Course Elapsed Days: 11
Plan Fractions Treated to Date: 7
Plan Prescribed Dose Per Fraction: 2.66 Gy
Plan Total Fractions Prescribed: 16
Plan Total Prescribed Dose: 42.56 Gy
Reference Point Dosage Given to Date: 18.62 Gy
Reference Point Session Dosage Given: 2.66 Gy
Session Number: 7

## 2023-09-05 ENCOUNTER — Other Ambulatory Visit: Payer: Self-pay

## 2023-09-05 ENCOUNTER — Ambulatory Visit
Admission: RE | Admit: 2023-09-05 | Discharge: 2023-09-05 | Disposition: A | Discharge status: Home / Self Care | Payer: 59 | Source: Ambulatory Visit | Attending: Radiation Oncology | Admitting: Radiation Oncology

## 2023-09-05 DIAGNOSIS — Z510 Encounter for antineoplastic radiation therapy: Secondary | ICD-10-CM | POA: Diagnosis not present

## 2023-09-05 LAB — RAD ONC ARIA SESSION SUMMARY
Course Elapsed Days: 12
Plan Fractions Treated to Date: 8
Plan Prescribed Dose Per Fraction: 2.66 Gy
Plan Total Fractions Prescribed: 16
Plan Total Prescribed Dose: 42.56 Gy
Reference Point Dosage Given to Date: 21.28 Gy
Reference Point Session Dosage Given: 2.66 Gy
Session Number: 8

## 2023-09-06 ENCOUNTER — Other Ambulatory Visit: Payer: Self-pay

## 2023-09-06 ENCOUNTER — Ambulatory Visit
Admission: RE | Admit: 2023-09-06 | Discharge: 2023-09-06 | Disposition: A | Discharge status: Home / Self Care | Payer: 59 | Source: Ambulatory Visit | Attending: Radiation Oncology | Admitting: Radiation Oncology

## 2023-09-06 DIAGNOSIS — Z510 Encounter for antineoplastic radiation therapy: Secondary | ICD-10-CM | POA: Diagnosis not present

## 2023-09-06 LAB — RAD ONC ARIA SESSION SUMMARY
Course Elapsed Days: 13
Plan Fractions Treated to Date: 9
Plan Prescribed Dose Per Fraction: 2.66 Gy
Plan Total Fractions Prescribed: 16
Plan Total Prescribed Dose: 42.56 Gy
Reference Point Dosage Given to Date: 23.94 Gy
Reference Point Session Dosage Given: 2.66 Gy
Session Number: 9

## 2023-09-07 ENCOUNTER — Ambulatory Visit
Admission: RE | Admit: 2023-09-07 | Discharge: 2023-09-07 | Disposition: A | Discharge status: Home / Self Care | Payer: 59 | Source: Ambulatory Visit | Attending: Radiation Oncology | Admitting: Radiation Oncology

## 2023-09-07 ENCOUNTER — Other Ambulatory Visit: Payer: Self-pay

## 2023-09-07 DIAGNOSIS — Z510 Encounter for antineoplastic radiation therapy: Secondary | ICD-10-CM | POA: Diagnosis not present

## 2023-09-07 LAB — RAD ONC ARIA SESSION SUMMARY
Course Elapsed Days: 14
Plan Fractions Treated to Date: 10
Plan Prescribed Dose Per Fraction: 2.66 Gy
Plan Total Fractions Prescribed: 16
Plan Total Prescribed Dose: 42.56 Gy
Reference Point Dosage Given to Date: 26.6 Gy
Reference Point Session Dosage Given: 2.66 Gy
Session Number: 10

## 2023-09-08 ENCOUNTER — Other Ambulatory Visit: Payer: Self-pay

## 2023-09-08 ENCOUNTER — Ambulatory Visit
Admission: RE | Admit: 2023-09-08 | Discharge: 2023-09-08 | Disposition: A | Discharge status: Home / Self Care | Payer: 59 | Source: Ambulatory Visit | Attending: Radiation Oncology | Admitting: Radiation Oncology

## 2023-09-08 DIAGNOSIS — Z510 Encounter for antineoplastic radiation therapy: Secondary | ICD-10-CM | POA: Diagnosis not present

## 2023-09-08 LAB — RAD ONC ARIA SESSION SUMMARY
Course Elapsed Days: 15
Plan Fractions Treated to Date: 11
Plan Prescribed Dose Per Fraction: 2.66 Gy
Plan Total Fractions Prescribed: 16
Plan Total Prescribed Dose: 42.56 Gy
Reference Point Dosage Given to Date: 29.26 Gy
Reference Point Session Dosage Given: 2.66 Gy
Session Number: 11

## 2023-09-11 ENCOUNTER — Ambulatory Visit
Admission: RE | Admit: 2023-09-11 | Discharge: 2023-09-11 | Disposition: A | Discharge status: Home / Self Care | Payer: 59 | Source: Ambulatory Visit | Attending: Radiation Oncology

## 2023-09-11 ENCOUNTER — Other Ambulatory Visit: Payer: Self-pay

## 2023-09-11 DIAGNOSIS — Z510 Encounter for antineoplastic radiation therapy: Secondary | ICD-10-CM | POA: Diagnosis not present

## 2023-09-11 LAB — RAD ONC ARIA SESSION SUMMARY
Course Elapsed Days: 18
Plan Fractions Treated to Date: 12
Plan Prescribed Dose Per Fraction: 2.66 Gy
Plan Total Fractions Prescribed: 16
Plan Total Prescribed Dose: 42.56 Gy
Reference Point Dosage Given to Date: 31.92 Gy
Reference Point Session Dosage Given: 2.66 Gy
Session Number: 12

## 2023-09-12 ENCOUNTER — Other Ambulatory Visit: Payer: Self-pay

## 2023-09-12 ENCOUNTER — Ambulatory Visit
Admission: RE | Admit: 2023-09-12 | Discharge: 2023-09-12 | Disposition: A | Discharge status: Home / Self Care | Payer: 59 | Source: Ambulatory Visit | Attending: Radiation Oncology | Admitting: Radiation Oncology

## 2023-09-12 DIAGNOSIS — Z510 Encounter for antineoplastic radiation therapy: Secondary | ICD-10-CM | POA: Diagnosis not present

## 2023-09-12 LAB — RAD ONC ARIA SESSION SUMMARY
Course Elapsed Days: 19
Plan Fractions Treated to Date: 13
Plan Prescribed Dose Per Fraction: 2.66 Gy
Plan Total Fractions Prescribed: 16
Plan Total Prescribed Dose: 42.56 Gy
Reference Point Dosage Given to Date: 34.58 Gy
Reference Point Session Dosage Given: 2.66 Gy
Session Number: 13

## 2023-09-13 ENCOUNTER — Other Ambulatory Visit: Payer: Self-pay

## 2023-09-13 ENCOUNTER — Ambulatory Visit
Admission: RE | Admit: 2023-09-13 | Discharge: 2023-09-13 | Disposition: A | Discharge status: Home / Self Care | Payer: 59 | Source: Ambulatory Visit | Attending: Radiation Oncology | Admitting: Radiation Oncology

## 2023-09-13 DIAGNOSIS — Z510 Encounter for antineoplastic radiation therapy: Secondary | ICD-10-CM | POA: Diagnosis not present

## 2023-09-13 LAB — RAD ONC ARIA SESSION SUMMARY
Course Elapsed Days: 20
Plan Fractions Treated to Date: 14
Plan Prescribed Dose Per Fraction: 2.66 Gy
Plan Total Fractions Prescribed: 16
Plan Total Prescribed Dose: 42.56 Gy
Reference Point Dosage Given to Date: 37.24 Gy
Reference Point Session Dosage Given: 2.66 Gy
Session Number: 14

## 2023-09-14 ENCOUNTER — Other Ambulatory Visit: Payer: Self-pay

## 2023-09-14 ENCOUNTER — Ambulatory Visit
Admission: RE | Admit: 2023-09-14 | Discharge: 2023-09-14 | Disposition: A | Discharge status: Home / Self Care | Payer: 59 | Source: Ambulatory Visit | Attending: Radiation Oncology | Admitting: Radiation Oncology

## 2023-09-14 DIAGNOSIS — Z510 Encounter for antineoplastic radiation therapy: Secondary | ICD-10-CM | POA: Diagnosis not present

## 2023-09-14 LAB — RAD ONC ARIA SESSION SUMMARY
Course Elapsed Days: 21
Plan Fractions Treated to Date: 15
Plan Prescribed Dose Per Fraction: 2.66 Gy
Plan Total Fractions Prescribed: 16
Plan Total Prescribed Dose: 42.56 Gy
Reference Point Dosage Given to Date: 39.9 Gy
Reference Point Session Dosage Given: 2.66 Gy
Session Number: 15

## 2023-09-15 ENCOUNTER — Ambulatory Visit
Admission: RE | Admit: 2023-09-15 | Discharge: 2023-09-15 | Disposition: A | Discharge status: Home / Self Care | Payer: 59 | Source: Ambulatory Visit | Attending: Radiation Oncology | Admitting: Radiation Oncology

## 2023-09-15 ENCOUNTER — Other Ambulatory Visit: Payer: Self-pay

## 2023-09-15 ENCOUNTER — Ambulatory Visit: Payer: 59 | Admitting: Radiation Oncology

## 2023-09-15 DIAGNOSIS — Z510 Encounter for antineoplastic radiation therapy: Secondary | ICD-10-CM | POA: Diagnosis not present

## 2023-09-15 LAB — RAD ONC ARIA SESSION SUMMARY
Course Elapsed Days: 22
Plan Fractions Treated to Date: 16
Plan Prescribed Dose Per Fraction: 2.66 Gy
Plan Total Fractions Prescribed: 16
Plan Total Prescribed Dose: 42.56 Gy
Reference Point Dosage Given to Date: 42.56 Gy
Reference Point Session Dosage Given: 2.66 Gy
Session Number: 16

## 2023-09-18 ENCOUNTER — Ambulatory Visit
Admission: RE | Admit: 2023-09-18 | Discharge: 2023-09-18 | Disposition: A | Discharge status: Home / Self Care | Payer: 59 | Source: Ambulatory Visit | Attending: Radiation Oncology | Admitting: Radiation Oncology

## 2023-09-18 ENCOUNTER — Other Ambulatory Visit: Payer: Self-pay

## 2023-09-18 DIAGNOSIS — Z510 Encounter for antineoplastic radiation therapy: Secondary | ICD-10-CM | POA: Diagnosis not present

## 2023-09-18 LAB — RAD ONC ARIA SESSION SUMMARY
Course Elapsed Days: 25
Plan Fractions Treated to Date: 1
Plan Prescribed Dose Per Fraction: 2 Gy
Plan Total Fractions Prescribed: 4
Plan Total Prescribed Dose: 8 Gy
Reference Point Dosage Given to Date: 2 Gy
Reference Point Session Dosage Given: 2 Gy
Session Number: 17

## 2023-09-19 ENCOUNTER — Other Ambulatory Visit: Payer: Self-pay

## 2023-09-19 ENCOUNTER — Ambulatory Visit
Admission: RE | Admit: 2023-09-19 | Discharge: 2023-09-19 | Disposition: A | Discharge status: Home / Self Care | Payer: 59 | Source: Ambulatory Visit | Attending: Radiation Oncology

## 2023-09-19 DIAGNOSIS — Z510 Encounter for antineoplastic radiation therapy: Secondary | ICD-10-CM | POA: Diagnosis not present

## 2023-09-19 LAB — RAD ONC ARIA SESSION SUMMARY
Course Elapsed Days: 26
Plan Fractions Treated to Date: 2
Plan Prescribed Dose Per Fraction: 2 Gy
Plan Total Fractions Prescribed: 4
Plan Total Prescribed Dose: 8 Gy
Reference Point Dosage Given to Date: 4 Gy
Reference Point Session Dosage Given: 2 Gy
Session Number: 18

## 2023-09-20 ENCOUNTER — Ambulatory Visit
Admission: RE | Admit: 2023-09-20 | Discharge: 2023-09-20 | Disposition: A | Discharge status: Home / Self Care | Payer: 59 | Source: Ambulatory Visit | Attending: Radiation Oncology | Admitting: Radiation Oncology

## 2023-09-20 ENCOUNTER — Other Ambulatory Visit: Payer: Self-pay

## 2023-09-20 DIAGNOSIS — Z510 Encounter for antineoplastic radiation therapy: Secondary | ICD-10-CM | POA: Diagnosis not present

## 2023-09-20 LAB — RAD ONC ARIA SESSION SUMMARY
Course Elapsed Days: 27
Plan Fractions Treated to Date: 3
Plan Prescribed Dose Per Fraction: 2 Gy
Plan Total Fractions Prescribed: 4
Plan Total Prescribed Dose: 8 Gy
Reference Point Dosage Given to Date: 6 Gy
Reference Point Session Dosage Given: 2 Gy
Session Number: 19

## 2023-09-21 ENCOUNTER — Ambulatory Visit
Admission: RE | Admit: 2023-09-21 | Discharge: 2023-09-21 | Disposition: A | Discharge status: Home / Self Care | Payer: 59 | Source: Ambulatory Visit | Attending: Radiation Oncology | Admitting: Radiation Oncology

## 2023-09-21 ENCOUNTER — Other Ambulatory Visit: Payer: Self-pay

## 2023-09-21 DIAGNOSIS — Z510 Encounter for antineoplastic radiation therapy: Secondary | ICD-10-CM | POA: Diagnosis not present

## 2023-09-21 LAB — RAD ONC ARIA SESSION SUMMARY
Course Elapsed Days: 28
Plan Fractions Treated to Date: 4
Plan Prescribed Dose Per Fraction: 2 Gy
Plan Total Fractions Prescribed: 4
Plan Total Prescribed Dose: 8 Gy
Reference Point Dosage Given to Date: 8 Gy
Reference Point Session Dosage Given: 2 Gy
Session Number: 20

## 2023-09-22 ENCOUNTER — Ambulatory Visit: Payer: 59

## 2023-09-22 NOTE — Radiation Completion Notes (Signed)
  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   Intent: Curative     ==========DELIVERED PLANS==========  First Treatment Date: 2023-08-24 Last Treatment Date: 2023-09-21   Plan Name: Breast_R Site: Breast, Right Technique: 3D Mode: Photon Dose Per Fraction: 2.66 Gy Prescribed Dose (Delivered / Prescribed): 42.56 Gy / 42.56 Gy Prescribed Fxs (Delivered / Prescribed): 16 / 16   Plan Name: Breast_R_Bst Site: Breast, Right Technique: 3D Mode: Photon Dose Per Fraction: 2 Gy Prescribed Dose (Delivered / Prescribed): 8 Gy / 8 Gy Prescribed Fxs (Delivered / Prescribed): 4 / 4     ==========ON TREATMENT VISIT DATES========== 2023-08-25, 2023-09-04, 2023-09-08, 2023-09-15   See weekly On Treatment Notes in Epic for details in the Media tab (listed as Progress notes on the On Treatment Visit Dates listed above). The patient tolerated radiation. She developed fatigue and anticipated skin changes in the treatment field.   The patient will receive a call in about one month from the radiation oncology department. She will continue follow up with Dr. Mosetta Putt as well.      Osker Mason, PAC

## 2023-10-06 NOTE — Telephone Encounter (Signed)
 Sydney Giles

## 2023-10-08 ENCOUNTER — Telehealth: Payer: Self-pay | Admitting: Hematology

## 2023-10-08 NOTE — Telephone Encounter (Signed)
 Left detailed vm for pt regarding rescheudling appt form 1/6 to 1/10 per dr.feng being out sick.

## 2023-10-09 ENCOUNTER — Inpatient Hospital Stay: Payer: 59

## 2023-10-09 ENCOUNTER — Inpatient Hospital Stay: Payer: 59 | Admitting: Hematology

## 2023-10-13 ENCOUNTER — Inpatient Hospital Stay (HOSPITAL_BASED_OUTPATIENT_CLINIC_OR_DEPARTMENT_OTHER): Payer: 59 | Admitting: Hematology

## 2023-10-13 ENCOUNTER — Inpatient Hospital Stay: Payer: 59 | Attending: Nurse Practitioner

## 2023-10-13 ENCOUNTER — Other Ambulatory Visit: Payer: Self-pay

## 2023-10-13 ENCOUNTER — Encounter: Payer: Self-pay | Admitting: Hematology

## 2023-10-13 VITALS — BP 142/87 | HR 73 | Temp 98.0°F | Resp 17 | Wt 227.4 lb

## 2023-10-13 DIAGNOSIS — Z79810 Long term (current) use of selective estrogen receptor modulators (SERMs): Secondary | ICD-10-CM | POA: Insufficient documentation

## 2023-10-13 DIAGNOSIS — D0511 Intraductal carcinoma in situ of right breast: Secondary | ICD-10-CM | POA: Insufficient documentation

## 2023-10-13 LAB — CMP (CANCER CENTER ONLY)
ALT: 20 U/L (ref 0–44)
AST: 18 U/L (ref 15–41)
Albumin: 4.3 g/dL (ref 3.5–5.0)
Alkaline Phosphatase: 59 U/L (ref 38–126)
Anion gap: 7 (ref 5–15)
BUN: 14 mg/dL (ref 6–20)
CO2: 27 mmol/L (ref 22–32)
Calcium: 9.6 mg/dL (ref 8.9–10.3)
Chloride: 106 mmol/L (ref 98–111)
Creatinine: 0.8 mg/dL (ref 0.44–1.00)
GFR, Estimated: >60 mL/min (ref >60)
Glucose, Bld: 99 mg/dL (ref 70–99)
Potassium: 4.6 mmol/L (ref 3.5–5.1)
Sodium: 140 mmol/L (ref 135–145)
Total Bilirubin: 0.5 mg/dL (ref 0.0–1.2)
Total Protein: 6.7 g/dL (ref 6.5–8.1)

## 2023-10-13 LAB — CBC WITH DIFFERENTIAL (CANCER CENTER ONLY)
Abs Immature Granulocytes: 0.04 10*3/uL (ref 0.00–0.07)
Basophils Absolute: 0.1 10*3/uL (ref 0.0–0.1)
Basophils Relative: 1 %
Eosinophils Absolute: 0.2 10*3/uL (ref 0.0–0.5)
Eosinophils Relative: 3 %
HCT: 41.8 % (ref 36.0–46.0)
Hemoglobin: 14.2 g/dL (ref 12.0–15.0)
Immature Granulocytes: 1 %
Lymphocytes Relative: 17 %
Lymphs Abs: 1 10*3/uL (ref 0.7–4.0)
MCH: 30.6 pg (ref 26.0–34.0)
MCHC: 34 g/dL (ref 30.0–36.0)
MCV: 90.1 fL (ref 80.0–100.0)
Monocytes Absolute: 0.6 10*3/uL (ref 0.1–1.0)
Monocytes Relative: 9 %
Neutro Abs: 4.1 10*3/uL (ref 1.7–7.7)
Neutrophils Relative %: 69 %
Platelet Count: 243 10*3/uL (ref 150–400)
RBC: 4.64 MIL/uL (ref 3.87–5.11)
RDW: 12.6 % (ref 11.5–15.5)
WBC Count: 5.9 10*3/uL (ref 4.0–10.5)
nRBC: 0 % (ref 0.0–0.2)

## 2023-10-13 MED ORDER — TAMOXIFEN CITRATE 10 MG PO TABS: 5.0000 mg | ORAL_TABLET | Freq: Every day | ORAL | 2 refills | Status: DC

## 2023-10-13 NOTE — Assessment & Plan Note (Signed)
-  Grade 2, ER/PR+ Discovered by screening mammogram -Status post lumpectomy in October 2024.  -She completed adjuvant radiation in December 2024

## 2023-10-13 NOTE — Progress Notes (Signed)
 [facility]   [phone] [phone]   Clinic Follow up Note   Patient Care Team: Hamrick, Charlene CROME, MD as PCP - General (Family Medicine) Darliss Rogue, MD as PCP - Cardiology (Cardiology) Tyree Nanetta SAILOR, RN as Oncology Nurse Navigator Glean Stephane BROCKS, RN as Oncology Nurse Navigator Vernetta Berg, MD as Consulting Physician (General Surgery) Lanny Callander, MD as Consulting Physician (Hematology) Dewey Rush, MD as Consulting Physician (Radiation Oncology)  Date of Service:  10/13/2023  CHIEF COMPLAINT: f/u of right breast DCIS  CURRENT THERAPY:  Pending tamoxifen   Oncology History   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  -She completed adjuvant radiation in December 2024   Assessment and Plan    Ductal Carcinoma In Situ (DCIS) Post-radiation follow-up for DCIS. Completed radiation therapy on September 21, 2023. Reports improved mild skin tenderness, rawness, and peeling. Emphasized continued surveillance with mammograms and breast MRIs. Discussed tamoxifen  therapy benefits, including a 50% risk reduction for future breast cancer, and over 80% when combined with radiation. Explained tamoxifen  side effects: hot flashes, menstrual impact, and need to monitor cholesterol, blood sugar, and weight. Low dose tamoxifen  (5 mg daily for three years) has a lower risk profile for blood clots and endometrial cancer compared to the full dose. - Prescribe tamoxifen  10 mg, instruct to take 5 mg daily for three years - Order diagnostic mammogram in September 2025 - Schedule breast MRI in 2026 - Follow up with nurse practitioner in three months - Follow up with physician in six months  Menstrual Irregularity Amenorrhea since October 2024 after discontinuing birth control pills. Discussed potential causes including stress and tamoxifen  impact, which may  cause lighter or absent periods. - Monitor menstrual cycles - Discuss significant changes or concerns during follow-up visits  General Health Maintenance Emphasized healthy diet and regular exercise to manage weight, cholesterol, and blood sugar levels, especially while on tamoxifen . Recommended dietary modifications: more vegetables, lean meats, less red meat and carbohydrates. - Encourage healthy diet and regular exercise - Monitor cholesterol, blood sugar, and weight regularly.      Plan -I recommend adjuvant tamoxifen  5 mg daily, prescription called in today, she can start anytime in the next months -Diagnostic mammogram in September 2025 -Survivorship in 3 months, follow-up with me in 6 months  SUMMARY OF ONCOLOGIC HISTORY: Oncology History  Ductal carcinoma in situ (DCIS) of right breast  06/19/2023 Mammogram   MM digital diagnostic mammogram unilateral right breast IMPRESSION: Indeterminate calcifications measuring 1.2 cm in the upper inner right breast.  RECOMMENDATION: Stereotactic core needle biopsy x1 of the right breast  BI-RADS CATEGORY  4: Suspicious.   06/27/2023 Pathology Results   Pathology from stereotactic biopsy PATHOLOGY revealed: Site Breast, RIGHT, needle core biopsy, upper inner, ribbon clip DUCTAL CARCINOMA IN SITU, CRIBRIFORM, INTERMEDIATE NUCLEAR GRADE NECROSIS: PRESENT CALCIFICATIONS: PRESENT DCIS LENGTH: 0.7 CM   07/12/2023 Initial Diagnosis   Ductal carcinoma in situ (DCIS) of right breast      Discussed the use of AI scribe software for clinical note transcription with the patient, who gave verbal consent to proceed.  History of Present Illness   The patient, a 48 year old female with a history of breast DCIS, presents for follow-up after completing radiation therapy in December. She reports mild skin reactions from the radiation, including tenderness, rawness, and peeling, but overall, she tolerated the treatment well and feels that her skin is  recovering.  She understands that this therapy is intended to reduce her risk of future breast cancer. She has not had a menstrual period since October, which may be due to stress or the cessation of birth control pills.  The patient also mentions that she is due for a mammogram in September and is considering undergoing an MRI for further screening, although she expresses some anxiety about the procedure due to claustrophobia.         All other systems were reviewed with the patient and are negative.  MEDICAL HISTORY:  Past Medical History:  Diagnosis Date   Abnormal Pap smear    Anxiety    Breast cancer (HCC) 07/2023   Ovarian cyst     SURGICAL HISTORY: Past Surgical History:  Procedure Laterality Date   BREAST BIOPSY Right 06/26/2023   MM RT BREAST BX W LOC DEV 1ST LESION IMAGE BX SPEC STEREO GUIDE 06/26/2023 [facility]   BREAST BIOPSY  07/26/2023   MM RT RADIOACTIVE SEED LOC MAMMO GUIDE 07/26/2023 [facility]   BREAST LUMPECTOMY WITH RADIOACTIVE SEED LOCALIZATION Right 07/27/2023   Procedure: RADIOACTIVE SEED GUIDED RIGHT BREAST LUMPECTOMY;  Surgeon: Vernetta Berg, MD;  Location: [facility];  Service: General;  Laterality: Right;  LMA   COLPOSCOPY     OVARIAN CYST SURGERY      I have reviewed the social history and family history with the patient and they are unchanged from previous note.  ALLERGIES:  has no known allergies.  MEDICATIONS:  Current Outpatient Medications  Medication Sig Dispense Refill   tamoxifen  (NOLVADEX ) 10 MG tablet Take 0.5 tablets (5 mg total) by mouth daily. 15 tablet 2   Cholecalciferol (VITAMIN D) 125 MCG (5000 UT) CAPS Take 5,000 Units by mouth daily.     escitalopram (LEXAPRO) 20 MG tablet Take 20 mg by mouth daily.     fluticasone (FLONASE) 50 MCG/ACT nasal spray Place 1 spray into both nostrils daily as needed for allergies or rhinitis.     Naphazoline-Pheniramine (OPCON-A) 0.027-0.315 % SOLN Place 1 drop into both eyes daily  as needed (allergies).     No current facility-administered medications for this visit.    PHYSICAL EXAMINATION: ECOG PERFORMANCE STATUS: 0 - Asymptomatic  Vitals:   10/13/23 0952  BP: (!) 142/87  Pulse: 73  Resp: 17  Temp: 98 F (36.7 C)  SpO2: 100%   Wt Readings from Last 3 Encounters:  10/13/23 227 lb 6.4 oz (103.1 kg)  08/15/23 220 lb (99.8 kg)  07/27/23 220 lb (99.8 kg)     GENERAL:alert, no distress and comfortable SKIN: skin color, texture, turgor are normal, no rashes or significant lesions EYES: normal, Conjunctiva are pink and non-injected, sclera clear NECK: supple, thyroid normal size, non-tender, without nodularity LYMPH:  no palpable lymphadenopathy in the cervical, axillary  LUNGS: clear to auscultation and percussion with normal breathing effort HEART: regular rate & rhythm and no murmurs and no lower extremity edema ABDOMEN:abdomen soft, non-tender and normal bowel sounds Musculoskeletal:no cyanosis of digits and no clubbing  NEURO: alert & oriented x 3 with fluent speech, no focal motor/sensory deficits Breast: : Diffuse skin hyperpigmentation of the right breast secondary to radiation, incision is well-healed, no palpable mass or adenopathy.     LABORATORY DATA:  I have reviewed the data as listed    Latest Ref Rng & Units 10/13/2023    9:36 AM 07/20/2023    9:00 AM 10/04/2022    8:01 PM  CBC  WBC 4.0 - 10.5 K/uL 5.9  6.2  8.6   Hemoglobin 12.0 - 15.0 g/dL 85.7  85.3  85.9   Hematocrit 36.0 - 46.0 % 41.8  45.3  42.8   Platelets 150 - 400 K/uL 243  306  285         Latest Ref Rng & Units 07/20/2023    9:00 AM 10/04/2022    8:01 PM 05/09/2022    9:53 AM  CMP  Glucose 70 - 99 mg/dL 93  97  899   BUN 6 - 20 mg/dL 12  8  12    Creatinine 0.44 - 1.00 mg/dL 9.25  9.38  9.30   Sodium 135 - 145 mmol/L 138  138  137   Potassium 3.5 - 5.1 mmol/L 4.1  3.7  4.0   Chloride 98 - 111 mmol/L 105  107  110   CO2 22 - 32 mmol/L 22  22  20    Calcium 8.9 - 10.3  mg/dL 9.2  8.7  9.0   Total Protein 6.5 - 8.1 g/dL  6.6    Total Bilirubin 0.3 - 1.2 mg/dL  0.4    Alkaline Phos 38 - 126 U/L  42    AST 15 - 41 U/L  20    ALT 0 - 44 U/L  15        RADIOGRAPHIC STUDIES: I have personally reviewed the radiological images as listed and agreed with the findings in the report. No results found.    Orders Placed This Encounter  Procedures   MM 3D DIAGNOSTIC MAMMOGRAM BILATERAL BREAST    Standing Status:   Future    Expected Date:   06/22/2024    Expiration Date:   10/12/2024    Reason for Exam (SYMPTOM  OR DIAGNOSIS REQUIRED):   screening    Preferred imaging location?:   [facility]    Is the patient pregnant?:   No   All questions were answered. The patient knows to call the clinic with any problems, questions or concerns. No barriers to learning was detected. The total time spent in the appointment was 30 minutes.     Onita Mattock, MD 10/13/2023

## 2023-10-17 ENCOUNTER — Encounter: Payer: Self-pay | Admitting: Hematology

## 2023-10-23 ENCOUNTER — Ambulatory Visit
Admission: RE | Admit: 2023-10-23 | Discharge: 2023-10-23 | Disposition: A | Discharge status: Home / Self Care | Payer: 59 | Source: Ambulatory Visit | Attending: Nurse Practitioner | Admitting: Nurse Practitioner

## 2023-10-23 NOTE — Progress Notes (Signed)
  Radiation Oncology         [phone] ________________________________  Name: Sydney Giles MRN: [id number]  Date of Service: 10/23/2023  DOB: Apr 28, 1976  Post Treatment Telephone Note  Diagnosis:  Intermediate grade ER/PR positive DCIS of the right breast. (as documented in provider EOT note)  The patient was available for call today.   Symptoms of fatigue have improved since completing therapy.  Symptoms of skin changes have improved since completing therapy.  The patient was encouraged to avoid sun exposure in the area of prior treatment for up to one year following radiation with either sunscreen or by the style of clothing worn in the sun.  The patient has scheduled follow up with her medical oncologist Dr. Mosetta Putt for ongoing surveillance, and was encouraged to call if she develops concerns or questions regarding radiation.   This concludes the interaction.  Ruel Favors, LPN

## 2024-01-10 NOTE — Progress Notes (Unsigned)
 CLINIC:  Survivorship   REASON FOR VISIT:  Routine follow-up post-treatment for a recent history of breast cancer.  BRIEF ONCOLOGIC HISTORY:  Oncology History  Ductal carcinoma in situ (DCIS) of right breast  06/19/2023 Mammogram   MM digital diagnostic mammogram unilateral right breast IMPRESSION: Indeterminate calcifications measuring 1.2 cm in the upper inner right breast.  RECOMMENDATION: Stereotactic core needle biopsy x1 of the right breast  BI-RADS CATEGORY  4: Suspicious.   06/27/2023 Pathology Results   Pathology from stereotactic biopsy PATHOLOGY revealed: Site Breast, RIGHT, needle core biopsy, upper inner, ribbon clip DUCTAL CARCINOMA IN SITU, CRIBRIFORM, INTERMEDIATE NUCLEAR GRADE NECROSIS: PRESENT CALCIFICATIONS: PRESENT DCIS LENGTH: 0.7 CM   07/12/2023 Initial Diagnosis   Ductal carcinoma in situ (DCIS) of right breast     INTERVAL HISTORY:  Sydney Giles presents to the Survivorship Clinic today for our initial meeting to review her survivorship care plan detailing her treatment course for breast cancer, as well as monitoring long-term side effects of that treatment, education regarding health maintenance, screening, and overall wellness and health promotion.     Overall, Sydney Giles reports feeling quite well since completing her radiation therapy approximately 3 months ago.  She started taking tamoxifen 5 mg in the beginning of march. Has had some hot flashes and night sweats, but nothing unmanageable. She continues to see her GYN provider. Pap smear is up to date. She states that her last pap was abnormal. She did have colposcopy. This turned out to be good. She has repeat pap scheduled for 06/2024. She states that she has not had a menstrual period since October 2024. Her GYN provider did evaluate her hormone levels which were in the menopausal range.   SOCIAL HISTORY:  Sydney Giles is /single/married/divorced/widowed/separated and lives alone/with her  spouse/family/friend in (city), [place].  She has (#) children and they live in [place], [place].  She denies any current or history of tobacco, alcohol, or illicit drug use.    ONCOLOGY TREATMENT TEAM:  1. Surgeon:  Dr. Magnus Ivan at [facility] 2. Medical Oncologist: Dr. Mosetta Putt  3. Radiation Oncologist: Dr. Mitzi Hansen    REVIEW OF SYSTEMS:  Review of Systems  Constitutional:  Positive for fatigue. Negative for appetite change, chills, fever and unexpected weight change.  HENT:  Negative.    Eyes: Negative.   Respiratory:  Negative for chest tightness, cough, shortness of breath and wheezing.   Cardiovascular:  Negative for chest pain, leg swelling and palpitations.  Gastrointestinal:  Negative for abdominal pain, blood in stool, constipation, diarrhea, nausea and vomiting.  Endocrine: Positive for hot flashes.  Genitourinary: Negative.    Musculoskeletal:  Negative for arthralgias, back pain, myalgias and neck stiffness.  Skin: Negative.   Neurological:  Negative for dizziness, extremity weakness, headaches, light-headedness and numbness.  Hematological: Negative.   Psychiatric/Behavioral:  Negative for decreased concentration, depression and suicidal ideas. The patient is not nervous/anxious.    Breast: Denies any new nodularity, masses, tenderness, nipple changes, or nipple discharge.    PAST MEDICAL/SURGICAL HISTORY:  Past Medical History:  Diagnosis Date   Abnormal Pap smear    Anxiety    Breast cancer (HCC) 07/2023   Ovarian cyst    Past Surgical History:  Procedure Laterality Date   BREAST BIOPSY Right 06/26/2023   MM RT BREAST BX W LOC DEV 1ST LESION IMAGE BX SPEC STEREO GUIDE 06/26/2023 [facility]   BREAST BIOPSY  07/26/2023   MM RT RADIOACTIVE SEED LOC MAMMO GUIDE 07/26/2023 [facility]  BREAST LUMPECTOMY WITH RADIOACTIVE SEED LOCALIZATION Right 07/27/2023   Procedure: RADIOACTIVE SEED GUIDED RIGHT BREAST LUMPECTOMY;  Surgeon: Abigail Miyamoto, MD;  Location: [facility];  Service: General;  Laterality: Right;  LMA   COLPOSCOPY     OVARIAN CYST SURGERY       ALLERGIES:  No Known Allergies   CURRENT MEDICATIONS:  Outpatient Encounter Medications as of 01/11/2024  Medication Sig   Cholecalciferol (VITAMIN D) 125 MCG (5000 UT) CAPS Take 5,000 Units by mouth daily.   doxycycline (VIBRA-TABS) 100 MG tablet Take 100 mg by mouth 2 (two) times daily.   escitalopram (LEXAPRO) 20 MG tablet Take 20 mg by mouth daily.   fluticasone (FLONASE) 50 MCG/ACT nasal spray Place 1 spray into both nostrils daily as needed for allergies or rhinitis.   Naphazoline-Pheniramine (OPCON-A) 0.027-0.315 % SOLN Place 1 drop into both eyes daily as needed (allergies).   tamoxifen (NOLVADEX) 10 MG tablet Take 0.5 tablets (5 mg total) by mouth daily.   No facility-administered encounter medications on file as of 01/11/2024.     ONCOLOGIC FAMILY HISTORY:  Family History  Problem Relation Age of Onset   Arthritis/Rheumatoid Mother    Diabetes Father    Breast cancer Paternal Aunt     PHYSICAL EXAMINATION:  Vital Signs:   Vitals:   01/11/24 0902  BP: (!) 140/90  Pulse: 80  Resp: 17  Temp: 97.9 F (36.6 C)  SpO2: 98%   Filed Weights   01/11/24 0902  Weight: 224 lb 8 oz (101.8 kg)   General: Well-nourished, well-appearing female in no acute distress.  She is unaccompanied today.  HEENT: Head is normocephalic.  Pupils equal and reactive to light. Conjunctivae clear without exudate.  Sclerae anicteric. Oral mucosa is pink, moist.  Oropharynx is pink without lesions or erythema.  Lymph: No cervical, supraclavicular, or infraclavicular lymphadenopathy noted on palpation.  Cardiovascular: Regular rate and rhythm.Marland Kitchen Respiratory: Clear to auscultation bilaterally. Chest expansion symmetric; breathing non-labored.  GI: Abdomen soft and round; non-tender, non-distended. Bowel sounds normoactive.  GU: Deferred.  Neuro: No focal deficits. Steady gait.   Psych: Mood and affect normal and appropriate for situation.  Extremities: No edema. MSK: No focal spinal tenderness to palpation.  Full range of motion in bilateral upper extremities Skin: Warm and dry.  LABORATORY DATA:  None for this visit.  DIAGNOSTIC IMAGING:  None for this visit.      ASSESSMENT AND PLAN:  Ms.. Giles is a pleasant 48 y.o. female with right breast invasive ductal carcinoma, ER+/PR+, diagnosed in September 2024, treated with lumpectomy, adjuvant radiation therapy, and anti-estrogen therapy with tamoxifen beginning in March 2025.  The goal is for her to remain on this medication for 3-5 years. She presents to the Survivorship Clinic for our initial meeting and routine follow-up post-completion of treatment for breast cancer.    1. Stage pTis, pNx right breast cancer:  Sydney Giles is continuing to recover from definitive treatment for breast cancer. She will follow-up with her medical oncologist, Dr. Mosetta Putt in July 2025 with history and physical exam per surveillance protocol.  She will continue her anti-estrogen therapy with tamoxifen. Thus far, she is tolerating the tamoxifen well, with minimal side effects. She was instructed to make Dr. Mosetta Putt or myself aware if she begins to experience any worsening side effects of the medication and I could see her back in clinic to help manage those side effects, as needed. Though the incidence is low, there is an associated risk of endometrial cancer  with anti-estrogen therapies like Tamoxifen.  Sydney Giles was encouraged to contact Dr. Mosetta Putt or myself with any vaginal bleeding while taking Tamoxifen. Other side effects of Tamoxifen were again reviewed with her as well. Today, a comprehensive survivorship care plan and treatment summary was reviewed with the patient today detailing her breast cancer diagnosis, treatment course, potential late/long-term effects of treatment, appropriate follow-up care with recommendations for the future, and  patient education resources.  A copy of this summary, along with a letter will be sent to the patient's primary care provider via mail/fax/In Basket message after today's visit.    1. Bone health:  Given Sydney Giles's history of breast cancer and her current treatment regimen including anti-estrogen therapy with tamoxifen, she is at minimal risk for bone demineralization.  She has not had a DEXA scan yet, as she is premenopausal. She was encouraged to increase her consumption of foods rich in calcium, as well as increase her weight-bearing activities.  She was given education on specific activities to promote bone health. She was encouraged to take OTC vitamin d 2000 international unit daily along with calcium supplement.   2. Cancer screening:  Due to Sydney Giles's history and her age, she should receive screening for skin cancers, colon cancer, and gynecologic cancers.  She is overdue for colon cancer screening. She is hesitant to have a colonoscopy done. We talked about the role of Cologuard testing. She will speak with her primary care provider about this testing. The information and recommendations are listed on the patient's comprehensive care plan/treatment summary and were reviewed in detail with the patient.    3. Health maintenance and wellness promotion: Sydney Giles was encouraged to consume 5-7 servings of fruits and vegetables per day. We reviewed the "Nutrition Rainbow" handout, as well as the handout "Take Control of Your Health and Reduce Your Cancer Risk" from the [employer].  She was also encouraged to engage in moderate to vigorous exercise for 30 minutes per day most days of the week. We discussed the [employer] fitness program, which is designed for cancer survivors to help them become more physically fit after cancer treatments.  She was instructed to limit her alcohol consumption and continue to abstain from tobacco use.    4. Support services/counseling: It is not  uncommon for this period of the patient's cancer care trajectory to be one of many emotions and stressors.  We discussed an opportunity for her to participate in the next session of [facility] ("Finding Your New Normal") support group series designed for patients after they have completed treatment.   Sydney Giles was encouraged to take advantage of our many other support services programs, support groups, and/or counseling in coping with her new life as a cancer survivor after completing anti-cancer treatment.  She was offered support today through active listening and expressive supportive counseling.  She was given information regarding our available services and encouraged to contact me with any questions or for help enrolling in any of our support group/programs.    Dispo:   -Return to cancer center April 11, 2024  -Mammogram due in September 2025 -Follow up with surgery June 2025 -She is welcome to return back to the Survivorship Clinic at any time; no additional follow-up needed at this time.  -Consider referral back to survivorship as a long-term survivor for continued surveillance  A total of (30) minutes of face-to-face time was spent with this patient with greater than 50% of that time in counseling and care-coordination.  Vincent Gros, NP Survivorship Program [facility] [phone]   Note: PRIMARY CARE PROVIDER Ailene Ravel, [place] [phone]

## 2024-01-11 ENCOUNTER — Encounter: Payer: Self-pay | Admitting: Nurse Practitioner

## 2024-01-11 ENCOUNTER — Inpatient Hospital Stay: Payer: 59 | Attending: Nurse Practitioner | Admitting: Nurse Practitioner

## 2024-01-11 VITALS — BP 140/90 | HR 80 | Temp 97.9°F | Resp 17 | Wt 224.5 lb

## 2024-01-11 DIAGNOSIS — D0511 Intraductal carcinoma in situ of right breast: Secondary | ICD-10-CM | POA: Insufficient documentation

## 2024-01-11 DIAGNOSIS — Z923 Personal history of irradiation: Secondary | ICD-10-CM | POA: Diagnosis not present

## 2024-01-11 DIAGNOSIS — Z79810 Long term (current) use of selective estrogen receptor modulators (SERMs): Secondary | ICD-10-CM | POA: Insufficient documentation

## 2024-01-11 NOTE — Assessment & Plan Note (Signed)
-  Grade 2, ER/PR+ Discovered by screening mammogram -Status post lumpectomy in October 2024.  -She completed adjuvant radiation in December 2024 -survivorship visit 01/11/2024 -diagnostic mammogram scheduled for 06/2024. -followed by GYN for abnormal pap smear and perimenopausal symptoms  -will discuss cologuard testing for colon cancer screening with her primary care provider.  -labs with follow up scheduled in Cancer Center 04/11/2024.

## 2024-01-19 ENCOUNTER — Other Ambulatory Visit: Payer: Self-pay | Admitting: Hematology

## 2024-04-04 DIAGNOSIS — N951 Menopausal and female climacteric states: Secondary | ICD-10-CM | POA: Diagnosis not present

## 2024-04-10 ENCOUNTER — Other Ambulatory Visit: Payer: Self-pay

## 2024-04-10 DIAGNOSIS — D0511 Intraductal carcinoma in situ of right breast: Secondary | ICD-10-CM

## 2024-04-10 NOTE — Assessment & Plan Note (Signed)
-  Grade 2, ER/PR+ Discovered by screening mammogram -Status post lumpectomy in October 2024.  -She completed adjuvant radiation in December 2024 - She started tamoxifen  5 mg daily in March 2025

## 2024-04-11 ENCOUNTER — Inpatient Hospital Stay: Payer: 59 | Attending: Nurse Practitioner

## 2024-04-11 ENCOUNTER — Ambulatory Visit: Payer: 59 | Admitting: Hematology

## 2024-04-11 ENCOUNTER — Other Ambulatory Visit: Payer: 59

## 2024-04-11 ENCOUNTER — Inpatient Hospital Stay: Payer: 59 | Admitting: Hematology

## 2024-04-11 VITALS — BP 135/89 | HR 69 | Temp 97.8°F | Resp 17 | Ht 66.0 in | Wt 228.4 lb

## 2024-04-11 DIAGNOSIS — Z79810 Long term (current) use of selective estrogen receptor modulators (SERMs): Secondary | ICD-10-CM | POA: Diagnosis not present

## 2024-04-11 DIAGNOSIS — D0511 Intraductal carcinoma in situ of right breast: Secondary | ICD-10-CM | POA: Insufficient documentation

## 2024-04-11 DIAGNOSIS — Z923 Personal history of irradiation: Secondary | ICD-10-CM | POA: Insufficient documentation

## 2024-04-11 LAB — CBC WITH DIFFERENTIAL (CANCER CENTER ONLY)
Abs Immature Granulocytes: 0.03 K/uL (ref 0.00–0.07)
Basophils Absolute: 0.1 K/uL (ref 0.0–0.1)
Basophils Relative: 1 %
Eosinophils Absolute: 0.2 K/uL (ref 0.0–0.5)
Eosinophils Relative: 3 %
HCT: 42.1 % (ref 36.0–46.0)
Hemoglobin: 14.3 g/dL (ref 12.0–15.0)
Immature Granulocytes: 1 %
Lymphocytes Relative: 25 %
Lymphs Abs: 1.4 K/uL (ref 0.7–4.0)
MCH: 30.3 pg (ref 26.0–34.0)
MCHC: 34 g/dL (ref 30.0–36.0)
MCV: 89.2 fL (ref 80.0–100.0)
Monocytes Absolute: 0.5 K/uL (ref 0.1–1.0)
Monocytes Relative: 10 %
Neutro Abs: 3.2 K/uL (ref 1.7–7.7)
Neutrophils Relative %: 60 %
Platelet Count: 227 K/uL (ref 150–400)
RBC: 4.72 MIL/uL (ref 3.87–5.11)
RDW: 13.1 % (ref 11.5–15.5)
WBC Count: 5.4 K/uL (ref 4.0–10.5)
nRBC: 0 % (ref 0.0–0.2)

## 2024-04-11 LAB — CMP (CANCER CENTER ONLY)
ALT: 14 U/L (ref 0–44)
AST: 16 U/L (ref 15–41)
Albumin: 3.9 g/dL (ref 3.5–5.0)
Alkaline Phosphatase: 64 U/L (ref 38–126)
Anion gap: 7 (ref 5–15)
BUN: 14 mg/dL (ref 6–20)
CO2: 22 mmol/L (ref 22–32)
Calcium: 9.2 mg/dL (ref 8.9–10.3)
Chloride: 110 mmol/L (ref 98–111)
Creatinine: 0.73 mg/dL (ref 0.44–1.00)
GFR, Estimated: >60 mL/min (ref >60)
Glucose, Bld: 101 mg/dL — ABNORMAL HIGH (ref 70–99)
Potassium: 3.9 mmol/L (ref 3.5–5.1)
Sodium: 139 mmol/L (ref 135–145)
Total Bilirubin: 0.4 mg/dL (ref 0.0–1.2)
Total Protein: 6.4 g/dL — ABNORMAL LOW (ref 6.5–8.1)

## 2024-04-11 NOTE — Progress Notes (Signed)
 [facility]   Telephone:([phone] Fax:([phone]) [phone]   Clinic Follow up Note   Patient Care Team: Hamrick, Charlene CROME, MD as PCP - General (Family Medicine) Darliss Rogue, MD as PCP - Cardiology (Cardiology) Tyree Nanetta SAILOR, RN as Oncology Nurse Navigator Glean, Stephane BROCKS, RN (Inactive) as Oncology Nurse Navigator Vernetta Berg, MD as Consulting Physician (General Surgery) Lanny Callander, MD as Consulting Physician (Hematology) Dewey Rush, MD as Consulting Physician (Radiation Oncology)  Date of Service:  04/11/2024  CHIEF COMPLAINT: f/u of right breast cancer  CURRENT THERAPY:  Tamoxifen  5 mg daily  Oncology History   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  -She completed adjuvant radiation in December 2024 - She started tamoxifen  5 mg daily in March 2025  Assessment & Plan Breast  DCIS Breast cancer is currently managed with tamoxifen , which she is tolerating well without side effects. The last mammogram was normal, and the surgical site on the right breast is healing well with some residual dark coloration from radiation, but no tenderness or lumps. Breast tissue around the nipple is dense, which is normal. - Continue tamoxifen  with three refills provided, sufficient until April next year - Schedule and complete mammogram in September - Follow up with surgeon in October, one year post-surgery - Schedule next oncology follow-up six months after the surgical follow-up  Menopause Menopause confirmed by blood work. Amenorrhea since October after discontinuing birth control pills due to breast cancer diagnosis. Experiencing hot flashes, which began prior to starting tamoxifen .  Plan - She is tolerating low-dose tamoxifen  well, exam was unremarkable. - Lab and follow-up in 6 months, then once a year after next visit   SUMMARY OF ONCOLOGIC  HISTORY: Oncology History  Ductal carcinoma in situ (DCIS) of right breast  06/19/2023 Mammogram   MM digital diagnostic mammogram unilateral right breast IMPRESSION: Indeterminate calcifications measuring 1.2 cm in the upper inner right breast.  RECOMMENDATION: Stereotactic core needle biopsy x1 of the right breast  BI-RADS CATEGORY  4: Suspicious.   06/27/2023 Pathology Results   Pathology from stereotactic biopsy PATHOLOGY revealed: Site Breast, RIGHT, needle core biopsy, upper inner, ribbon clip DUCTAL CARCINOMA IN SITU, CRIBRIFORM, INTERMEDIATE NUCLEAR GRADE NECROSIS: PRESENT CALCIFICATIONS: PRESENT DCIS LENGTH: 0.7 CM   07/12/2023 Initial Diagnosis   Ductal carcinoma in situ (DCIS) of right breast   07/27/2023 Cancer Staging   Staging form: Breast, AJCC 8th Edition - Pathologic stage from 07/27/2023: Stage Unknown (pTis (DCIS), pNX, G2, ER+, PR+, HER2: Not Assessed) - Signed by Lanny Callander, MD on 02/02/2024 Stage prefix: Initial diagnosis Histologic grading system: 3 grade system      Discussed the use of AI scribe software for clinical note transcription with the patient, who gave verbal consent to proceed.  History of Present Illness Sydney Giles is a 48 year old female with breast cancer who presents for follow-up.  She has no new symptoms since the last visit three months ago and is taking tamoxifen  without any side effects. Menstrual periods ceased since October, confirmed by blood work indicating menopause. She experiences hot flashes, which began prior to starting tamoxifen . Her last mammogram was normal, with the next one scheduled for September. She performs regular breast self-exams and notes no new lumps or changes. Blood counts are normal.     All other systems were reviewed with the patient and are negative.  MEDICAL HISTORY:  Past Medical History:  Diagnosis  Date   Abnormal Pap smear    Anxiety    Breast cancer (HCC) 07/2023   Ovarian cyst     SURGICAL  HISTORY: Past Surgical History:  Procedure Laterality Date   BREAST BIOPSY Right 06/26/2023   MM RT BREAST BX W LOC DEV 1ST LESION IMAGE BX SPEC STEREO GUIDE 06/26/2023 [facility]   BREAST BIOPSY  07/26/2023   MM RT RADIOACTIVE SEED LOC MAMMO GUIDE 07/26/2023 [facility]   BREAST LUMPECTOMY WITH RADIOACTIVE SEED LOCALIZATION Right 07/27/2023   Procedure: RADIOACTIVE SEED GUIDED RIGHT BREAST LUMPECTOMY;  Surgeon: Vernetta Berg, MD;  Location: [facility] OR;  Service: General;  Laterality: Right;  LMA   COLPOSCOPY     OVARIAN CYST SURGERY      I have reviewed the social history and family history with the patient and they are unchanged from previous note.  ALLERGIES:  has no known allergies.  MEDICATIONS:  Current Outpatient Medications  Medication Sig Dispense Refill   Cholecalciferol (VITAMIN D) 125 MCG (5000 UT) CAPS Take 5,000 Units by mouth daily.     doxycycline (VIBRA-TABS) 100 MG tablet Take 100 mg by mouth 2 (two) times daily.     escitalopram (LEXAPRO) 20 MG tablet Take 20 mg by mouth daily.     fluticasone (FLONASE) 50 MCG/ACT nasal spray Place 1 spray into both nostrils daily as needed for allergies or rhinitis.     Naphazoline-Pheniramine (OPCON-A) 0.027-0.315 % SOLN Place 1 drop into both eyes daily as needed (allergies).     tamoxifen  (NOLVADEX ) 10 MG tablet TAKE 1/2 TABLET (5 MG TOTAL) BY MOUTH DAILY. 15 tablet 3   No current facility-administered medications for this visit.    PHYSICAL EXAMINATION: ECOG PERFORMANCE STATUS: 0 - Asymptomatic  Vitals:   04/11/24 0931  BP: 135/89  Pulse: 69  Resp: 17  Temp: 97.8 F (36.6 C)  SpO2: 100%   Wt Readings from Last 3 Encounters:  04/11/24 103.6 kg (228 lb 6.4 oz)  01/11/24 101.8 kg (224 lb 8 oz)  10/13/23 103.1 kg (227 lb 6.4 oz)     GENERAL:alert, no distress and comfortable SKIN: skin color, texture, turgor are normal, no rashes or significant lesions EYES: normal, Conjunctiva are pink and  non-injected, sclera clear NECK: supple, thyroid normal size, non-tender, without nodularity LYMPH:  no palpable lymphadenopathy in the cervical, axillary  LUNGS: clear to auscultation and percussion with normal breathing effort HEART: regular rate & rhythm and no murmurs and no lower extremity edema ABDOMEN:abdomen soft, non-tender and normal bowel sounds Musculoskeletal:no cyanosis of digits and no clubbing  NEURO: alert & oriented x 3 with fluent speech, no focal motor/sensory deficits BREAST: Right breast post-surgical changes with dark pigmentation from radiation, no tenderness, no palpable masses, dense tissue around the nipple. Physical Exam   LABORATORY DATA:  I have reviewed the data as listed    Latest Ref Rng & Units 04/11/2024    9:21 AM 10/13/2023    9:36 AM 07/20/2023    9:00 AM  CBC  WBC 4.0 - 10.5 K/uL 5.4  5.9  6.2   Hemoglobin 12.0 - 15.0 g/dL 85.6  85.7  85.3   Hematocrit 36.0 - 46.0 % 42.1  41.8  45.3   Platelets 150 - 400 K/uL 227  243  306         Latest Ref Rng & Units 04/11/2024    9:21 AM 10/13/2023    9:36 AM 07/20/2023    9:00 AM  CMP  Glucose 70 -  99 mg/dL 898  99  93   BUN 6 - 20 mg/dL 14  14  12    Creatinine 0.44 - 1.00 mg/dL 9.26  9.19  9.25   Sodium 135 - 145 mmol/L 139  140  138   Potassium 3.5 - 5.1 mmol/L 3.9  4.6  4.1   Chloride 98 - 111 mmol/L 110  106  105   CO2 22 - 32 mmol/L 22  27  22    Calcium 8.9 - 10.3 mg/dL 9.2  9.6  9.2   Total Protein 6.5 - 8.1 g/dL 6.4  6.7    Total Bilirubin 0.0 - 1.2 mg/dL 0.4  0.5    Alkaline Phos 38 - 126 U/L 64  59    AST 15 - 41 U/L 16  18    ALT 0 - 44 U/L 14  20        RADIOGRAPHIC STUDIES: I have personally reviewed the radiological images as listed and agreed with the findings in the report. No results found.    No orders of the defined types were placed in this encounter.  All questions were answered. The patient knows to call the clinic with any problems, questions or concerns. No  barriers to learning was detected. The total time spent in the appointment was 20 minutes, including review of chart and various tests results, discussions about plan of care and coordination of care plan     Onita Mattock, MD 04/11/2024

## 2024-04-18 DIAGNOSIS — L718 Other rosacea: Secondary | ICD-10-CM | POA: Diagnosis not present

## 2024-04-26 DIAGNOSIS — F419 Anxiety disorder, unspecified: Secondary | ICD-10-CM | POA: Diagnosis not present

## 2024-04-26 DIAGNOSIS — Z0000 Encounter for general adult medical examination without abnormal findings: Secondary | ICD-10-CM | POA: Diagnosis not present

## 2024-04-26 DIAGNOSIS — M2550 Pain in unspecified joint: Secondary | ICD-10-CM | POA: Diagnosis not present

## 2024-04-26 DIAGNOSIS — Z1331 Encounter for screening for depression: Secondary | ICD-10-CM | POA: Diagnosis not present

## 2024-04-26 DIAGNOSIS — E559 Vitamin D deficiency, unspecified: Secondary | ICD-10-CM | POA: Diagnosis not present

## 2024-04-26 DIAGNOSIS — Z83438 Family history of other disorder of lipoprotein metabolism and other lipidemia: Secondary | ICD-10-CM | POA: Diagnosis not present

## 2024-04-26 DIAGNOSIS — F32A Depression, unspecified: Secondary | ICD-10-CM | POA: Diagnosis not present

## 2024-04-26 DIAGNOSIS — D0511 Intraductal carcinoma in situ of right breast: Secondary | ICD-10-CM | POA: Diagnosis not present

## 2024-05-24 ENCOUNTER — Other Ambulatory Visit: Payer: Self-pay | Admitting: Nurse Practitioner

## 2024-06-12 DIAGNOSIS — R87612 Low grade squamous intraepithelial lesion on cytologic smear of cervix (LGSIL): Secondary | ICD-10-CM | POA: Diagnosis not present

## 2024-06-12 DIAGNOSIS — Z01419 Encounter for gynecological examination (general) (routine) without abnormal findings: Secondary | ICD-10-CM | POA: Diagnosis not present

## 2024-06-12 DIAGNOSIS — Z6836 Body mass index (BMI) 36.0-36.9, adult: Secondary | ICD-10-CM | POA: Diagnosis not present

## 2024-06-19 ENCOUNTER — Other Ambulatory Visit: Payer: Self-pay | Admitting: Hematology

## 2024-06-19 ENCOUNTER — Ambulatory Visit
Admission: RE | Admit: 2024-06-19 | Discharge: 2024-06-19 | Disposition: A | Discharge status: Home / Self Care | Source: Ambulatory Visit | Attending: Hematology | Admitting: Hematology

## 2024-06-19 ENCOUNTER — Ambulatory Visit
Admission: RE | Admit: 2024-06-19 | Discharge: 2024-06-19 | Disposition: A | Discharge status: Home / Self Care | Payer: 59 | Source: Ambulatory Visit | Attending: Hematology | Admitting: Hematology

## 2024-06-19 DIAGNOSIS — D0511 Intraductal carcinoma in situ of right breast: Secondary | ICD-10-CM

## 2024-06-19 DIAGNOSIS — N6315 Unspecified lump in the right breast, overlapping quadrants: Secondary | ICD-10-CM | POA: Diagnosis not present

## 2024-06-19 DIAGNOSIS — R928 Other abnormal and inconclusive findings on diagnostic imaging of breast: Secondary | ICD-10-CM | POA: Diagnosis not present

## 2024-06-19 HISTORY — DX: Personal history of irradiation: Z92.3

## 2024-06-28 DIAGNOSIS — R87612 Low grade squamous intraepithelial lesion on cytologic smear of cervix (LGSIL): Secondary | ICD-10-CM | POA: Diagnosis not present

## 2024-09-16 ENCOUNTER — Other Ambulatory Visit: Payer: Self-pay | Admitting: Hematology

## 2024-09-19 DIAGNOSIS — L309 Dermatitis, unspecified: Secondary | ICD-10-CM | POA: Diagnosis not present

## 2024-09-19 DIAGNOSIS — L719 Rosacea, unspecified: Secondary | ICD-10-CM | POA: Diagnosis not present

## 2025-01-16 ENCOUNTER — Other Ambulatory Visit

## 2025-01-16 ENCOUNTER — Ambulatory Visit: Admitting: Hematology
